# Patient Record
Sex: Male | Born: 1975 | Race: White | Hispanic: No | Marital: Single | Smoking: Never smoker
Health system: Southern US, Community
[De-identification: ages and names within clinical notes are randomized; demographics above are authoritative.]

## PROBLEM LIST (undated history)

## (undated) DIAGNOSIS — IMO0001 Reserved for inherently not codable concepts without codable children: Secondary | ICD-10-CM

## (undated) DIAGNOSIS — I2510 Atherosclerotic heart disease of native coronary artery without angina pectoris: Secondary | ICD-10-CM

## (undated) DIAGNOSIS — C6290 Malignant neoplasm of unspecified testis, unspecified whether descended or undescended: Secondary | ICD-10-CM

## (undated) DIAGNOSIS — K219 Gastro-esophageal reflux disease without esophagitis: Secondary | ICD-10-CM

## (undated) HISTORY — DX: Gastro-esophageal reflux disease without esophagitis: K21.9

## (undated) HISTORY — PX: TOOTH EXTRACTION: SUR596

## (undated) HISTORY — DX: Atherosclerotic heart disease of native coronary artery without angina pectoris: I25.10

## (undated) HISTORY — DX: Reserved for inherently not codable concepts without codable children: IMO0001

---

## 1995-03-13 DIAGNOSIS — C6290 Malignant neoplasm of unspecified testis, unspecified whether descended or undescended: Secondary | ICD-10-CM

## 1995-03-13 HISTORY — PX: OTHER SURGICAL HISTORY: SHX169

## 1995-03-13 HISTORY — DX: Malignant neoplasm of unspecified testis, unspecified whether descended or undescended: C62.90

## 1997-09-09 ENCOUNTER — Emergency Department (HOSPITAL_COMMUNITY): Admission: EM | Admit: 1997-09-09 | Discharge: 1997-09-09 | Payer: Self-pay | Admitting: Emergency Medicine

## 1997-12-12 ENCOUNTER — Emergency Department (HOSPITAL_COMMUNITY): Admission: EM | Admit: 1997-12-12 | Discharge: 1997-12-12 | Payer: Self-pay | Admitting: Emergency Medicine

## 1997-12-22 ENCOUNTER — Emergency Department (HOSPITAL_COMMUNITY): Admission: EM | Admit: 1997-12-22 | Discharge: 1997-12-22 | Payer: Self-pay | Admitting: Emergency Medicine

## 2000-08-20 ENCOUNTER — Ambulatory Visit (HOSPITAL_COMMUNITY): Admission: RE | Admit: 2000-08-20 | Discharge: 2000-08-20 | Payer: Self-pay | Admitting: Internal Medicine

## 2000-08-20 ENCOUNTER — Encounter: Payer: Self-pay | Admitting: Internal Medicine

## 2001-03-20 ENCOUNTER — Encounter: Admission: RE | Admit: 2001-03-20 | Discharge: 2001-03-20 | Payer: Self-pay | Admitting: Internal Medicine

## 2001-03-20 ENCOUNTER — Encounter: Payer: Self-pay | Admitting: Internal Medicine

## 2002-09-24 ENCOUNTER — Encounter: Admission: RE | Admit: 2002-09-24 | Discharge: 2002-09-24 | Payer: Self-pay | Admitting: Oncology

## 2002-09-24 ENCOUNTER — Encounter: Payer: Self-pay | Admitting: Oncology

## 2003-03-13 HISTORY — PX: ANTERIOR CRUCIATE LIGAMENT REPAIR: SHX115

## 2003-08-04 ENCOUNTER — Emergency Department (HOSPITAL_COMMUNITY): Admission: EM | Admit: 2003-08-04 | Discharge: 2003-08-04 | Payer: Self-pay | Admitting: Emergency Medicine

## 2003-10-07 ENCOUNTER — Encounter: Payer: Self-pay | Admitting: Internal Medicine

## 2004-01-23 ENCOUNTER — Emergency Department (HOSPITAL_COMMUNITY): Admission: EM | Admit: 2004-01-23 | Discharge: 2004-01-23 | Payer: Self-pay | Admitting: *Deleted

## 2004-06-30 ENCOUNTER — Ambulatory Visit: Payer: Self-pay | Admitting: Internal Medicine

## 2004-08-01 ENCOUNTER — Ambulatory Visit: Payer: Self-pay | Admitting: Internal Medicine

## 2004-09-26 ENCOUNTER — Ambulatory Visit: Payer: Self-pay | Admitting: Internal Medicine

## 2004-09-29 ENCOUNTER — Ambulatory Visit: Payer: Self-pay | Admitting: Oncology

## 2004-10-06 ENCOUNTER — Ambulatory Visit: Payer: Self-pay | Admitting: Internal Medicine

## 2005-01-11 ENCOUNTER — Ambulatory Visit: Payer: Self-pay | Admitting: Internal Medicine

## 2005-02-10 IMAGING — CR DG ELBOW COMPLETE 3+V*R*
2 series · 2 of 2 positions shown · non-contrast
Comparison: none

CLINICAL DATA: Right elbow pain after trauma to the olecranon process.
 RIGHT ELBOW 4 VIEWS ? 08/04/03
 There is no fracture or joint effusion.  There is soft tissue swelling over the olecranon process consistent with olecranon bursitis.
 IMPRESSION
 Olecranon bursitis.  No fracture or joint effusion.

[view not recorded (1 of 2)]
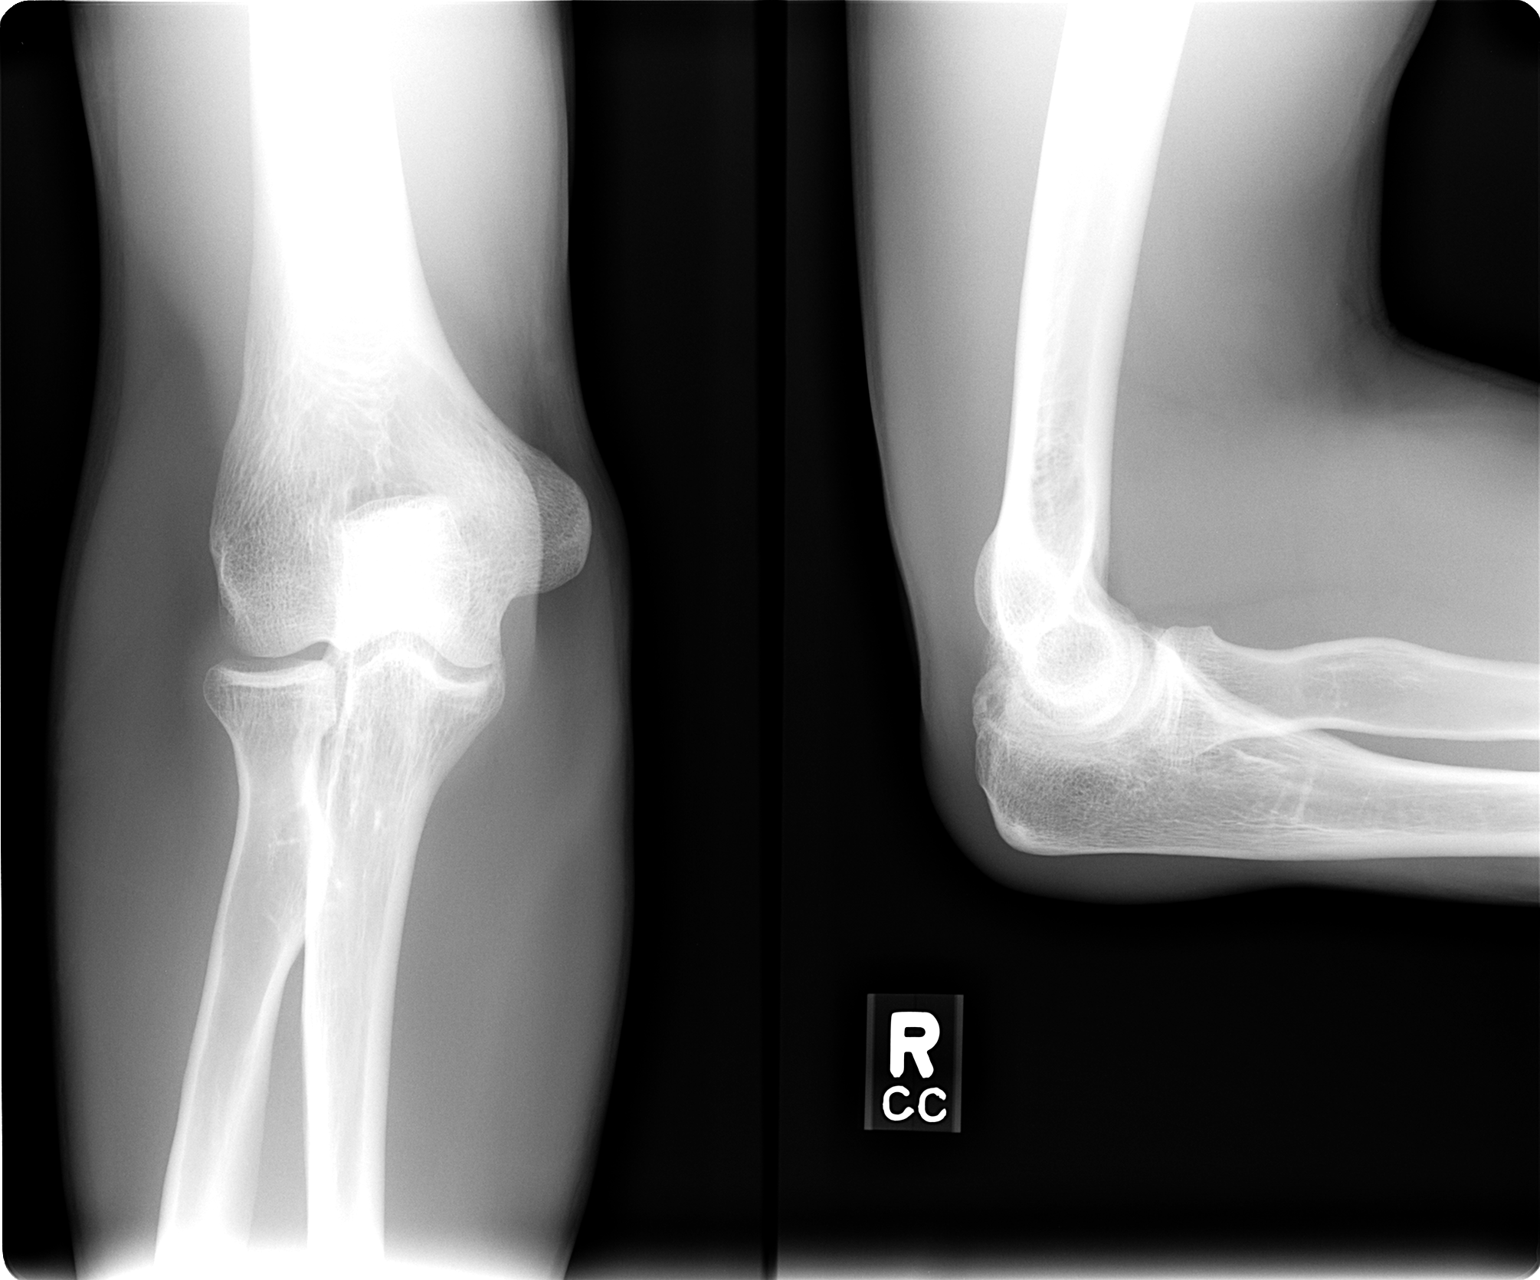

[view not recorded (2 of 2)]
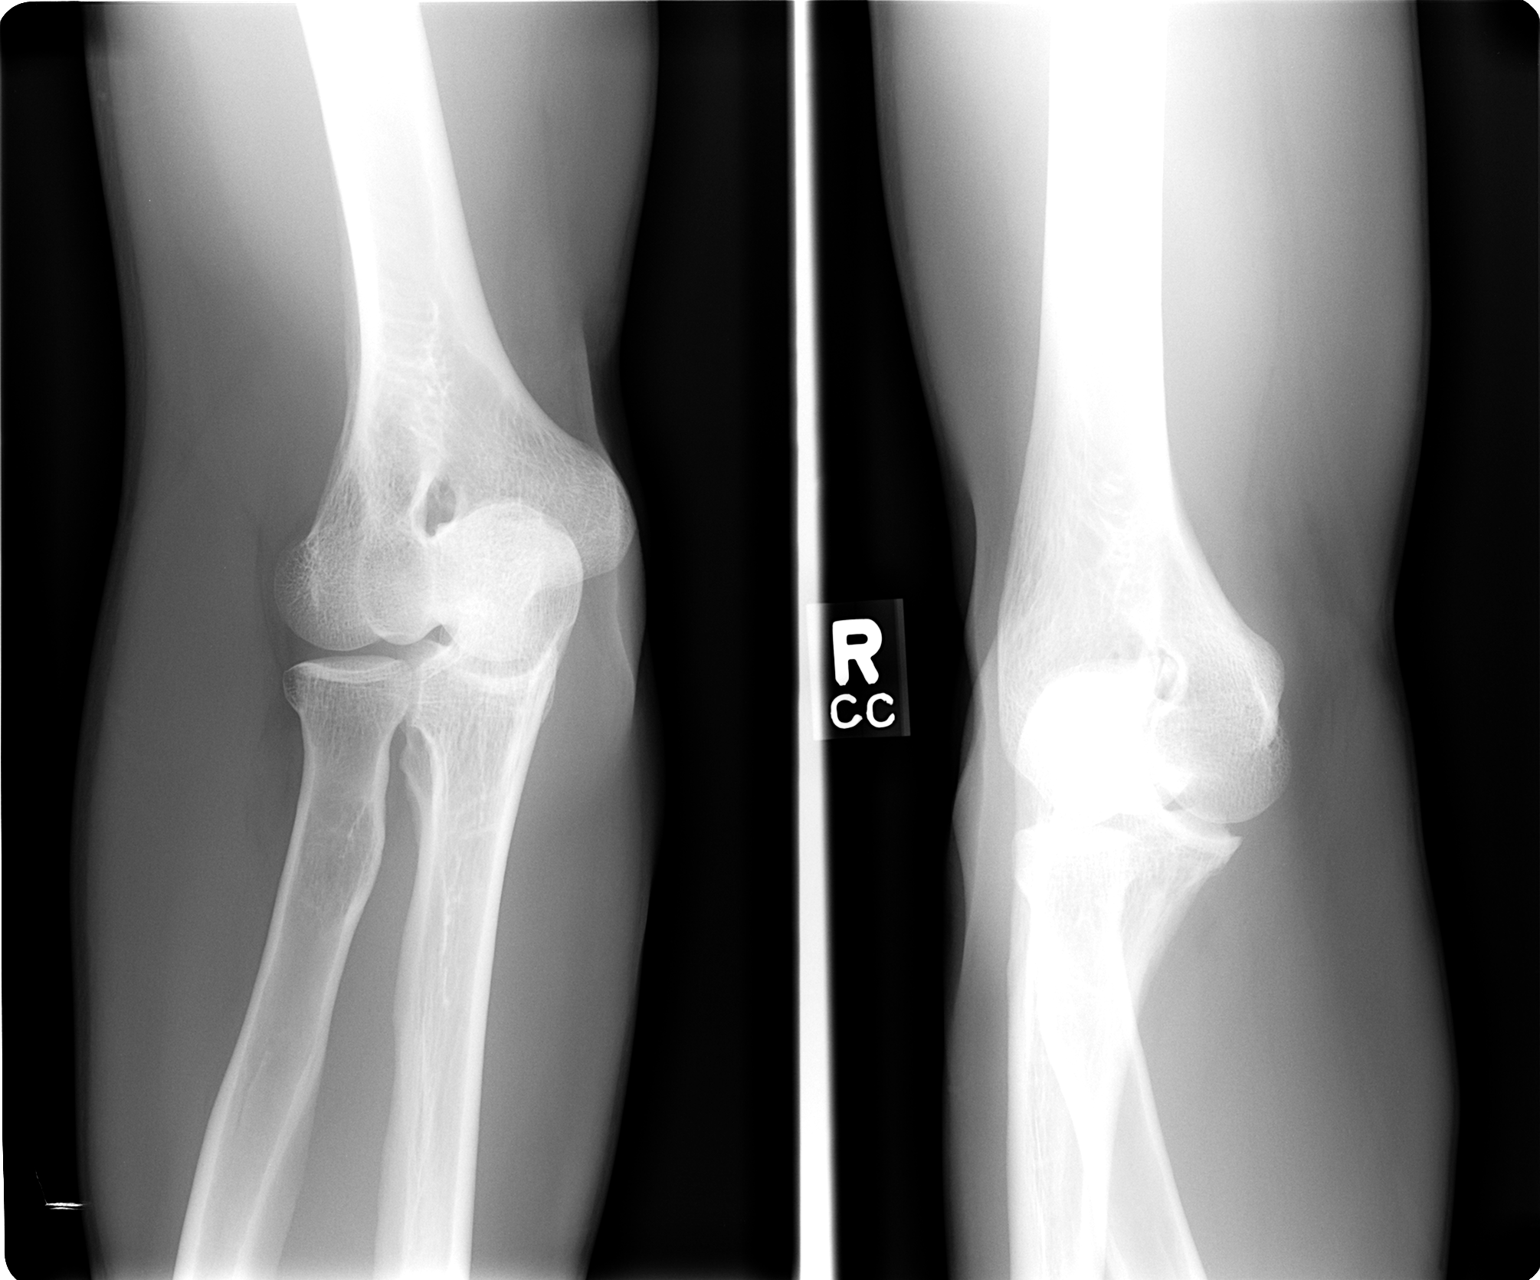

[2 of 2 positions shown; findings below may reference images not displayed]

## 2005-04-17 ENCOUNTER — Ambulatory Visit: Payer: Self-pay | Admitting: Internal Medicine

## 2005-09-25 ENCOUNTER — Ambulatory Visit: Payer: Self-pay | Admitting: Internal Medicine

## 2006-01-02 ENCOUNTER — Ambulatory Visit: Payer: Self-pay | Admitting: Internal Medicine

## 2006-02-04 ENCOUNTER — Ambulatory Visit: Payer: Self-pay | Admitting: Internal Medicine

## 2006-02-11 ENCOUNTER — Ambulatory Visit: Payer: Self-pay | Admitting: Internal Medicine

## 2006-02-11 LAB — CONVERTED CEMR LAB
ALT: 18 units/L (ref 0–40)
AST: 21 units/L (ref 0–37)
Albumin: 4.3 g/dL (ref 3.5–5.2)
Alkaline Phosphatase: 62 units/L (ref 39–117)
BUN: 16 mg/dL (ref 6–23)
Basophils Absolute: 0 10*3/uL (ref 0.0–0.1)
Basophils Relative: 0.2 % (ref 0.0–1.0)
CO2: 30 meq/L (ref 19–32)
Calcium: 9 mg/dL (ref 8.4–10.5)
Chloride: 106 meq/L (ref 96–112)
Chol/HDL Ratio, serum: 3.7
Cholesterol: 159 mg/dL (ref 0–200)
Creatinine, Ser: 1.2 mg/dL (ref 0.4–1.5)
Eosinophil percent: 1.4 % (ref 0.0–5.0)
GFR calc non Af Amer: 75 mL/min
Glomerular Filtration Rate, Af Am: 90 mL/min/{1.73_m2}
Glucose, Bld: 102 mg/dL — ABNORMAL HIGH (ref 70–99)
HCT: 48 % (ref 39.0–52.0)
HDL: 42.5 mg/dL (ref >39.0)
Hemoglobin: 16 g/dL (ref 13.0–17.0)
LDL Cholesterol: 105 mg/dL — ABNORMAL HIGH (ref 0–99)
Lymphocytes Relative: 28.8 % (ref 12.0–46.0)
MCHC: 33.4 g/dL (ref 30.0–36.0)
MCV: 93.2 fL (ref 78.0–100.0)
Monocytes Absolute: 0.4 10*3/uL (ref 0.2–0.7)
Monocytes Relative: 10.1 % (ref 3.0–11.0)
Neutro Abs: 2.6 10*3/uL (ref 1.4–7.7)
Neutrophils Relative %: 59.5 % (ref 43.0–77.0)
Platelets: 211 10*3/uL (ref 150–400)
Potassium: 3.7 meq/L (ref 3.5–5.1)
RBC: 5.15 M/uL (ref 4.22–5.81)
RDW: 12 % (ref 11.5–14.6)
Sodium: 143 meq/L (ref 135–145)
TSH: 1.41 microintl units/mL (ref 0.35–5.50)
Total Bilirubin: 1 mg/dL (ref 0.3–1.2)
Total Protein: 7.2 g/dL (ref 6.0–8.3)
Triglyceride fasting, serum: 60 mg/dL (ref 0–149)
VLDL: 12 mg/dL (ref 0–40)
WBC: 4.4 10*3/uL — ABNORMAL LOW (ref 4.5–10.5)

## 2006-02-18 ENCOUNTER — Ambulatory Visit: Payer: Self-pay | Admitting: Internal Medicine

## 2006-12-11 ENCOUNTER — Ambulatory Visit: Payer: Self-pay | Admitting: Internal Medicine

## 2007-02-03 ENCOUNTER — Telehealth: Payer: Self-pay | Admitting: Internal Medicine

## 2007-02-21 ENCOUNTER — Telehealth: Payer: Self-pay | Admitting: Internal Medicine

## 2007-07-01 ENCOUNTER — Ambulatory Visit: Payer: Self-pay | Admitting: Internal Medicine

## 2007-07-15 ENCOUNTER — Ambulatory Visit: Payer: Self-pay | Admitting: Gastroenterology

## 2007-07-15 ENCOUNTER — Encounter: Payer: Self-pay | Admitting: Gastroenterology

## 2007-08-14 ENCOUNTER — Ambulatory Visit: Payer: Self-pay | Admitting: Internal Medicine

## 2007-08-18 ENCOUNTER — Telehealth: Payer: Self-pay | Admitting: Gastroenterology

## 2007-08-29 ENCOUNTER — Telehealth: Payer: Self-pay | Admitting: Internal Medicine

## 2007-11-14 ENCOUNTER — Emergency Department (HOSPITAL_COMMUNITY): Admission: EM | Admit: 2007-11-14 | Discharge: 2007-11-14 | Payer: Self-pay | Admitting: Emergency Medicine

## 2007-11-14 ENCOUNTER — Telehealth: Payer: Self-pay | Admitting: Internal Medicine

## 2008-01-05 ENCOUNTER — Telehealth: Payer: Self-pay | Admitting: Internal Medicine

## 2008-01-08 ENCOUNTER — Telehealth: Payer: Self-pay | Admitting: Internal Medicine

## 2008-02-10 ENCOUNTER — Ambulatory Visit: Payer: Self-pay | Admitting: Gastroenterology

## 2008-02-18 ENCOUNTER — Ambulatory Visit: Payer: Self-pay | Admitting: Internal Medicine

## 2008-02-18 LAB — CONVERTED CEMR LAB
ALT: 23 units/L (ref 0–53)
Alkaline Phosphatase: 59 units/L (ref 39–117)
Basophils Absolute: 0 10*3/uL (ref 0.0–0.1)
Bilirubin Urine: NEGATIVE
Bilirubin, Direct: 0.1 mg/dL (ref 0.0–0.3)
Blood in Urine, dipstick: NEGATIVE
CO2: 30 meq/L (ref 19–32)
Calcium: 9.3 mg/dL (ref 8.4–10.5)
Cholesterol: 201 mg/dL (ref 0–200)
GFR calc Af Amer: 99 mL/min
Glucose, Bld: 96 mg/dL (ref 70–99)
Glucose, Urine, Semiquant: NEGATIVE
HDL: 43.3 mg/dL (ref >39.0)
Ketones, urine, test strip: NEGATIVE
Lymphocytes Relative: 23.6 % (ref 12.0–46.0)
MCHC: 35.4 g/dL (ref 30.0–36.0)
Monocytes Relative: 12.3 % — ABNORMAL HIGH (ref 3.0–12.0)
Neutro Abs: 3.2 10*3/uL (ref 1.4–7.7)
Platelets: 162 10*3/uL (ref 150–400)
Potassium: 3.9 meq/L (ref 3.5–5.1)
Protein, U semiquant: NEGATIVE
RDW: 12 % (ref 11.5–14.6)
Sodium: 141 meq/L (ref 135–145)
TSH: 1.66 microintl units/mL (ref 0.35–5.50)
Total Bilirubin: 1 mg/dL (ref 0.3–1.2)
Total CHOL/HDL Ratio: 4.6
Total Protein: 7.3 g/dL (ref 6.0–8.3)
Triglycerides: 70 mg/dL (ref 0–149)
Urobilinogen, UA: 0.2
VLDL: 14 mg/dL (ref 0–40)
pH: 7

## 2008-02-19 ENCOUNTER — Ambulatory Visit: Payer: Self-pay | Admitting: Internal Medicine

## 2008-02-19 DIAGNOSIS — Z8547 Personal history of malignant neoplasm of testis: Secondary | ICD-10-CM

## 2008-03-30 ENCOUNTER — Telehealth: Payer: Self-pay | Admitting: Gastroenterology

## 2008-03-31 ENCOUNTER — Ambulatory Visit: Payer: Self-pay | Admitting: Gastroenterology

## 2008-04-02 ENCOUNTER — Telehealth: Payer: Self-pay | Admitting: Gastroenterology

## 2008-04-28 ENCOUNTER — Telehealth: Payer: Self-pay | Admitting: Internal Medicine

## 2008-11-19 ENCOUNTER — Ambulatory Visit: Payer: Self-pay | Admitting: Family Medicine

## 2008-11-19 ENCOUNTER — Telehealth: Payer: Self-pay | Admitting: Internal Medicine

## 2008-11-26 ENCOUNTER — Ambulatory Visit: Payer: Self-pay | Admitting: Oncology

## 2008-11-26 ENCOUNTER — Encounter: Payer: Self-pay | Admitting: Internal Medicine

## 2008-11-26 LAB — CBC WITH DIFFERENTIAL/PLATELET
BASO%: 0.4 % (ref 0.0–2.0)
EOS%: 1 % (ref 0.0–7.0)
MCH: 31.9 pg (ref 27.2–33.4)
MCV: 91 fL (ref 79.3–98.0)
MONO%: 9.7 % (ref 0.0–14.0)
NEUT#: 4.3 10*3/uL (ref 1.5–6.5)
RBC: 4.72 10*6/uL (ref 4.20–5.82)
RDW: 12.8 % (ref 11.0–14.6)

## 2008-11-29 LAB — COMPREHENSIVE METABOLIC PANEL
ALT: 17 U/L (ref 0–53)
Alkaline Phosphatase: 54 U/L (ref 39–117)
Sodium: 138 mEq/L (ref 135–145)
Total Bilirubin: 0.5 mg/dL (ref 0.3–1.2)
Total Protein: 7.4 g/dL (ref 6.0–8.3)

## 2008-11-30 ENCOUNTER — Telehealth (INDEPENDENT_AMBULATORY_CARE_PROVIDER_SITE_OTHER): Payer: Self-pay | Admitting: *Deleted

## 2009-02-02 ENCOUNTER — Telehealth: Payer: Self-pay | Admitting: Internal Medicine

## 2009-06-03 ENCOUNTER — Telehealth: Payer: Self-pay | Admitting: Family Medicine

## 2009-12-14 ENCOUNTER — Telehealth: Payer: Self-pay | Admitting: Internal Medicine

## 2009-12-22 ENCOUNTER — Ambulatory Visit: Payer: Self-pay | Admitting: Internal Medicine

## 2010-02-23 ENCOUNTER — Telehealth: Payer: Self-pay | Admitting: Internal Medicine

## 2010-02-27 ENCOUNTER — Ambulatory Visit: Payer: Self-pay | Admitting: Internal Medicine

## 2010-04-06 ENCOUNTER — Encounter: Payer: Self-pay | Admitting: Internal Medicine

## 2010-04-06 ENCOUNTER — Ambulatory Visit
Admission: RE | Admit: 2010-04-06 | Discharge: 2010-04-06 | Payer: Self-pay | Source: Home / Self Care | Attending: Internal Medicine | Admitting: Internal Medicine

## 2010-04-06 ENCOUNTER — Other Ambulatory Visit: Payer: Self-pay | Admitting: Internal Medicine

## 2010-04-06 LAB — HEPATIC FUNCTION PANEL
Bilirubin, Direct: 0.1 mg/dL (ref 0.0–0.3)
Total Bilirubin: 0.6 mg/dL (ref 0.3–1.2)

## 2010-04-06 LAB — BASIC METABOLIC PANEL
BUN: 21 mg/dL (ref 6–23)
CO2: 27 mEq/L (ref 19–32)
Chloride: 103 mEq/L (ref 96–112)
Creatinine, Ser: 1 mg/dL (ref 0.4–1.5)
Glucose, Bld: 83 mg/dL (ref 70–99)

## 2010-04-06 LAB — LIPID PANEL
Cholesterol: 178 mg/dL (ref 0–200)
HDL: 38.9 mg/dL — ABNORMAL LOW (ref >39.00)
LDL Cholesterol: 126 mg/dL — ABNORMAL HIGH (ref 0–99)
Total CHOL/HDL Ratio: 5
Triglycerides: 66 mg/dL (ref 0.0–149.0)
VLDL: 13.2 mg/dL (ref 0.0–40.0)

## 2010-04-06 LAB — CBC WITH DIFFERENTIAL/PLATELET
Eosinophils Absolute: 0.1 10*3/uL (ref 0.0–0.7)
MCHC: 34.3 g/dL (ref 30.0–36.0)
MCV: 93.5 fl (ref 78.0–100.0)
Monocytes Absolute: 0.5 10*3/uL (ref 0.1–1.0)
Neutrophils Relative %: 63.2 % (ref 43.0–77.0)
Platelets: 203 10*3/uL (ref 150.0–400.0)
RDW: 13.7 % (ref 11.5–14.6)

## 2010-04-10 LAB — CONVERTED CEMR LAB
LDH: 160 units/L (ref 94–250)
hCG, Beta Chain, Quant, S: <2 milliintl units/mL

## 2010-04-11 NOTE — Progress Notes (Signed)
Summary: REQ FOR ORDERS  Phone Note Call from Patient   Caller: Patient   [phone] Summary of Call: Pt called and made appt for cpx / labwork...Jose KitchenMarland Reyes Pt would like to know if he can have an order for cancer markers to be added to cpx labwork  so the results can be sent to Dr Cyndie Chime at [facility]....Jose KitchenMarland KitchenCan you advise what labs need to be ordered?    Initial call taken by: Debbra Riding,  December 14, 2009 2:44 PM  Follow-up for Phone Call        yes  Beta-HCG Follow-up by: Birdie Sons MD,  December 15, 2009 11:15 AM  Additional Follow-up for Phone Call Additional follow up Details #1::        Added to labwork appt on 10/12.  Additional Follow-up by: Debbra Riding,  December 15, 2009 11:42 AM

## 2010-04-11 NOTE — Progress Notes (Signed)
Summary: REQ FOR RX sw pt who wants antibiotic now  Phone Note Call from Patient   Caller: Patient   [phone] Call For: fry for swords Reason for Call: Talk to Nurse, Talk to Doctor Summary of Call: Pt called to adv that he has been exp sxs of sore throat, cough, congestion (head/nasal).... Pt adv that he is currently out of town working and will be leaving on Sunday 06/05/2009 to go out of the country ([place]Marland KitchenMarland KitchenTherefore, he can't come in for OV this week.... Pt would like to have an antibiotic / [facility] [address]  Allergic to Pcn  Pt can be reached at [phone] with any questions or concerns. Calling again.  Requests this be done now, he is out of town & can't come to office,  he has to have med, Dr. Cato Mulligan is my doctor, & you have to get this done for me now.  Wants Dr. Clent Ridges or any doctor to do this for him.  Rudy Jew, RN  June 03, 2009 3:06 PM  Initial call taken by: Debbra Riding,  June 03, 2009 10:45 AM  Follow-up for Phone Call        call in a Zpack Follow-up by: Nelwyn Salisbury MD,  June 03, 2009 3:34 PM  Additional Follow-up for Phone Call Additional follow up Details #1::        Rx Called In Additional Follow-up by: Raechel Ache, RN,  June 03, 2009 4:01 PM

## 2010-04-13 NOTE — Assessment & Plan Note (Signed)
Summary: cpx/pt coming in fasting/med check/cjr   Vital Signs:  Patient profile:   35 year old male Height:      76 inches Weight:      192 pounds BMI:     23.46 Temp:     98.4 degrees F oral Pulse rate:   72 / minute Pulse rhythm:   regular BP sitting:   130 / 80  (left arm) Cuff size:   large  Vitals Entered By: Alfred Levins, CMA (April 06, 2010 8:25 AM) CC: cpx, fasting   Primary Care Provider:  Birdie Sons, MD  CC:  cpx and fasting.  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 One Q 2 To 3 Days As Directed--Needs Office Visit For Additional Refills--Last Refill Before Office Visit  Allergies (verified): 1)  ! * Bee Stings 2)  Pcn  Family History: Reviewed history from 07/01/2007 and no changes required. Family History Hypertension Family History of Cardiovascular disorder father diagnosed with esophageal or stomach cancer, 2009  Social History: Reviewed history from 11/15/2006 and no changes required. Never Smoked Alcohol use-yes has long term girlfriedn one dtr-healthy  Physical Exam  General:  alert and well-developed.   Head:  normocephalic and atraumatic.   Eyes:  pupils equal and pupils round.   Ears:  R ear normal and L ear normal.   Neck:  No deformities, masses, or tenderness noted. Lungs:  Normal respiratory effort, chest expands symmetrically. Lungs are clear to auscultation, no crackles or wheezes. Heart:  normal rate and regular rhythm.   Abdomen:  Bowel sounds positive,abdomen soft and non-tender without masses, organomegaly or hernias noted. Genitalia:  one testicle no masses circumcised.   Msk:  No deformity or scoliosis noted of thoracic or lumbar spine.   Neurologic:  cranial nerves II-XII intact and gait normal.    Skin:  turgor normal and color normal.   Psych:   normally interactive.     Impression & Recommendations:  Problem # 1:  ROUTINE GENERAL MEDICAL EXAM@HEALTH  CARE FACL (ICD-V70.0) health maint UTD Orders: UA Dipstick w/o Micro (automated)  (81003) Venipuncture (16109) Specimen Handling (60454) TLB-Lipid Panel (80061-LIPID) TLB-BMP (Basic Metabolic Panel-BMET) (80048-METABOL) TLB-CBC Platelet - w/Differential (85025-CBCD) TLB-Hepatic/Liver Function Pnl (80076-HEPATIC) TLB-TSH (Thyroid Stimulating Hormone) (84443-TSH)  Complete Medication List: 1)  Imipramine Hcl 10 Mg Tabs (Imipramine hcl) .... One every 2-3 nights as needed for sleep  Other Orders: T-LDH (09811-91478) T-AFP Tumor Markers [phone]) T-Hcg (57846-96295) Prescriptions: IMIPRAMINE HCL 10 MG  TABS (IMIPRAMINE HCL) one every 2-3 nights as needed for sleep  #60 x 3   Entered and Authorized by:   Birdie Sons MD   Signed by:   Birdie Sons MD on 04/06/2010   Method used:   [facility]        [phone][facility] (retail)       [address] [place], [place]  [address]       Ph: [phone] or [phone]       Fax: [phone]   RxID:   [id number]    Orders Added: 1)  UA Dipstick w/o Micro (automated)  [81003] 2)  Venipuncture [10932] 3)  Specimen Handling [99000] 4)  TLB-Lipid Panel [80061-LIPID] 5)  TLB-BMP (Basic Metabolic Panel-BMET) [80048-METABOL] 6)  TLB-CBC Platelet -  w/Differential [85025-CBCD] 7)  TLB-Hepatic/Liver Function Pnl [80076-HEPATIC] 8)  TLB-TSH (Thyroid Stimulating Hormone) [84443-TSH] 9)  Est. Patient 18-39 years [99395] 10)  T-LDH [16109-60454] 11)  T-AFP Tumor Markers [82105-81230] 12)  T-Hcg [09811-91478]  Appended Document: Orders Update    Clinical Lists Changes  Observations: Added new observation of COMMENTS: Wynona Canes, CMA  April 06, 2010 10:06 AM  (04/06/2010 10:06) Added new observation of PH URINE: 6.5  (04/06/2010  10:06) Added new observation of SPEC GR URIN: 1.020  (04/06/2010 10:06) Added new observation of APPEARANCE U: Clear  (04/06/2010 10:06) Added new observation of UA COLOR: yellow  (04/06/2010 10:06) Added new observation of WBC DIPSTK U: negative  (04/06/2010 10:06) Added new observation of NITRITE URN: negative  (04/06/2010 10:06) Added new observation of UROBILINOGEN: 0.2  (04/06/2010 10:06) Added new observation of PROTEIN, URN: negative  (04/06/2010 10:06) Added new observation of BLOOD UR DIP: negative  (04/06/2010 10:06) Added new observation of KETONES URN: negative  (04/06/2010 10:06) Added new observation of BILIRUBIN UR: negative  (04/06/2010 10:06) Added new observation of GLUCOSE, URN: negative  (04/06/2010 10:06)      Laboratory Results   Urine Tests  Date/Time Recieved: April 06, 2010 10:06 AM  Date/Time Reported: April 06, 2010 10:06 AM   Routine Urinalysis   Color: yellow Appearance: Clear Glucose: negative   (Normal Range: Negative) Bilirubin: negative   (Normal Range: Negative) Ketone: negative   (Normal Range: Negative) Spec. Gravity: 1.020   (Normal Range: 1.003-1.035) Blood: negative   (Normal Range: Negative) pH: 6.5   (Normal Range: 5.0-8.0) Protein: negative   (Normal Range: Negative) Urobilinogen: 0.2   (Normal Range: 0-1) Nitrite: negative   (Normal Range: Negative) Leukocyte Esterace: negative   (Normal Range: Negative)    Comments: Wynona Canes, CMA  April 06, 2010 10:06 AM

## 2010-04-13 NOTE — Progress Notes (Signed)
Summary: work in cpx before end of yr   Phone Note Call from Patient Call back at [employer] [phone]   Caller: Patient Call For: Birdie Sons MD Summary of Call: pt is requesting cpx before end of yr. Pt is aware short notice. Can create 30 min slot? Initial call taken by: Heron Sabins,  February 23, 2010 3:44 PM  Follow-up for Phone Call        ok Follow-up by: Birdie Sons MD,  February 24, 2010 12:15 PM  Additional Follow-up for Phone Call Additional follow up Details #1::        lmom lmom 02/28/2010 Additional Follow-up by: Heron Sabins,  February 27, 2010 10:02 AM    Additional Follow-up for Phone Call Additional follow up Details #2::    lmom/lmom 03-03-2010 430pm  Follow-up by: Heron Sabins,  March 02, 2010 8:56 AM  Additional Follow-up for Phone Call Additional follow up Details #3:: Details for Additional Follow-up Action Taken: AFTER SEVERAL ATTEMPTS PT DID NOT CB Additional Follow-up by: Heron Sabins,  March 21, 2010 8:38 AM

## 2010-06-05 ENCOUNTER — Other Ambulatory Visit: Payer: Self-pay | Admitting: Internal Medicine

## 2010-07-31 ENCOUNTER — Telehealth: Payer: Self-pay | Admitting: *Deleted

## 2010-07-31 NOTE — Telephone Encounter (Signed)
Ok to increase wellburtrin to 300mg 

## 2010-07-31 NOTE — Telephone Encounter (Signed)
patient  Would like a refill of his Wellbutrin.  He would like to go back to the 300 mg if possible.

## 2010-08-01 MED ORDER — BUPROPION HCL ER (XL) 300 MG PO TB24: 300.0000 mg | ORAL_TABLET | ORAL | Status: DC

## 2010-08-01 NOTE — Telephone Encounter (Signed)
LMTCB

## 2010-08-01 NOTE — Telephone Encounter (Signed)
Notified pt. 

## 2010-08-21 ENCOUNTER — Encounter: Payer: Self-pay | Admitting: Internal Medicine

## 2010-08-21 ENCOUNTER — Ambulatory Visit (INDEPENDENT_AMBULATORY_CARE_PROVIDER_SITE_OTHER): Payer: BC Managed Care – PPO | Admitting: Internal Medicine

## 2010-08-21 VITALS — BP 130/82 | Temp 98.2°F | Ht 76.0 in | Wt 200.0 lb

## 2010-08-21 DIAGNOSIS — J069 Acute upper respiratory infection, unspecified: Secondary | ICD-10-CM

## 2010-08-21 NOTE — Patient Instructions (Signed)
ZEZIRA  1 teaspoon every 6 hours as needed for cough and congestion  Get plenty of rest, Drink lots of  clear liquids, and use Tylenol or ibuprofen for fever and discomfort.    Call or return to clinic prn if these symptoms worsen or fail to improve as anticipated.

## 2010-08-21 NOTE — Progress Notes (Signed)
  Subjective:    Patient ID: Jose Reyes, male    DOB: Aug 07, 1975, 35 y.o.   MRN: [id number]  HPI 35 year old patient who has a five-day history of nasal congestion and mild cough. He has had no fever.  He will be leaving town later in the week and was concerned about the clinical worsening. Nasal drainages mildly discolored denies any headache localized sinus or dental pain    Review of Systems  Constitutional: Negative for fever, chills, appetite change and fatigue.  HENT: Positive for congestion. Negative for hearing loss, ear pain, sore throat, trouble swallowing, neck stiffness, dental problem, voice change and tinnitus.   Eyes: Negative for pain, discharge and visual disturbance.  Respiratory: Negative for cough, chest tightness, wheezing and stridor.   Cardiovascular: Negative for chest pain, palpitations and leg swelling.  Gastrointestinal: Negative for nausea, vomiting, abdominal pain, diarrhea, constipation, blood in stool and abdominal distention.  Genitourinary: Negative for urgency, hematuria, flank pain, discharge, difficulty urinating and genital sores.  Musculoskeletal: Negative for myalgias, back pain, joint swelling, arthralgias and gait problem.  Skin: Negative for rash.  Neurological: Negative for dizziness, syncope, speech difficulty, weakness, numbness and headaches.  Hematological: Negative for adenopathy. Does not bruise/bleed easily.  Psychiatric/Behavioral: Negative for behavioral problems and dysphoric mood. The patient is not nervous/anxious.        Objective:   Physical Exam  Constitutional: He is oriented to person, place, and time. He appears well-developed.  HENT:  Head: Normocephalic.  Right Ear: External ear normal.  Left Ear: External ear normal.       No sinus tenderness  Eyes: Conjunctivae and EOM are normal.  Neck: Normal range of motion.  Cardiovascular: Normal rate and normal heart sounds.   Pulmonary/Chest: Breath sounds normal.  Abdominal:  Bowel sounds are normal.  Musculoskeletal: Normal range of motion. He exhibits no edema and no tenderness.  Neurological: He is alert and oriented to person, place, and time.  Psychiatric: He has a normal mood and affect. His behavior is normal.          Assessment & Plan:  Viral URI. Will treat symptomatically

## 2010-10-25 ENCOUNTER — Encounter: Payer: Self-pay | Admitting: Family Medicine

## 2010-10-25 ENCOUNTER — Ambulatory Visit (INDEPENDENT_AMBULATORY_CARE_PROVIDER_SITE_OTHER): Payer: BC Managed Care – PPO | Admitting: Family Medicine

## 2010-10-25 VITALS — BP 138/86 | HR 68 | Temp 98.5°F | Wt 196.0 lb

## 2010-10-25 DIAGNOSIS — J329 Chronic sinusitis, unspecified: Secondary | ICD-10-CM

## 2010-10-25 MED ORDER — AZITHROMYCIN 250 MG PO TABS: ORAL_TABLET | ORAL | Status: AC

## 2010-10-25 NOTE — Progress Notes (Signed)
  Subjective:    Patient ID: Jose Reyes, male    DOB: March 30, 1975, 35 y.o.   MRN: [id number]  HPI Here for 3 days of sinus pressure, PND, HA, ST, blowing green mucus form the nose, and a dry cough. No fever.    Review of Systems  Constitutional: Negative.   HENT: Positive for congestion, postnasal drip and sinus pressure.   Eyes: Negative.   Respiratory: Positive for cough.        Objective:   Physical Exam  Constitutional: He appears well-developed and well-nourished.  HENT:  Right Ear: External ear normal.  Left Ear: External ear normal.  Nose: Nose normal.  Mouth/Throat: Oropharynx is clear and moist. No oropharyngeal exudate.  Eyes: Conjunctivae are normal. Pupils are equal, round, and reactive to light.  Neck: No thyromegaly present.  Pulmonary/Chest: Effort normal and breath sounds normal. No respiratory distress. He has no wheezes. He has no rales. He exhibits no tenderness.  Lymphadenopathy:    He has no cervical adenopathy.          Assessment & Plan:  Rest , fluids

## 2010-11-01 ENCOUNTER — Other Ambulatory Visit: Payer: Self-pay | Admitting: Internal Medicine

## 2011-01-02 ENCOUNTER — Other Ambulatory Visit: Payer: Self-pay | Admitting: Internal Medicine

## 2011-01-02 MED ORDER — ACYCLOVIR 5 % EX OINT: TOPICAL_OINTMENT | Freq: Three times a day (TID) | CUTANEOUS | Status: DC

## 2011-01-02 NOTE — Telephone Encounter (Signed)
Ok per Dr Swords, rx sent in electronically 

## 2011-01-02 NOTE — Telephone Encounter (Signed)
Pt is req to get renewal for ZOVIRAX 5 %  CREA (ACYCLOVIR) Apply three times a day  #1 g. x 3 [facility] [address] [facility] [address] [facility]. Pt is getting a cold sore on lip. Needs asap.

## 2011-01-04 ENCOUNTER — Other Ambulatory Visit: Payer: Self-pay | Admitting: Internal Medicine

## 2011-01-29 ENCOUNTER — Telehealth: Payer: Self-pay | Admitting: Internal Medicine

## 2011-01-29 NOTE — Telephone Encounter (Signed)
Ok to draw labs and check bp. This can be done as nursing visit

## 2011-01-29 NOTE — Telephone Encounter (Signed)
Pt has to have a biometric screening done before 02/16/11 for his ins. Needs to include bp check, finger stick test for blood glucose and cholesterol lvls. Pt req work in [employer] with pcp or another doctor if ok.

## 2011-01-29 NOTE — Telephone Encounter (Signed)
Needs also total chol, ldl,hdl,triglyceride bp and weight. Please advise patient.thanks.

## 2011-01-30 NOTE — Telephone Encounter (Signed)
lmoam to return my call. °

## 2011-01-31 NOTE — Telephone Encounter (Signed)
Pt will come next Tuesday as indicated. Thanks.

## 2011-02-06 ENCOUNTER — Ambulatory Visit (INDEPENDENT_AMBULATORY_CARE_PROVIDER_SITE_OTHER): Payer: BC Managed Care – PPO | Admitting: Internal Medicine

## 2011-02-06 DIAGNOSIS — Z0000 Encounter for general adult medical examination without abnormal findings: Secondary | ICD-10-CM

## 2011-02-06 LAB — LIPID PANEL
HDL: 51.5 mg/dL (ref >39.00)
LDL Cholesterol: 132 mg/dL — ABNORMAL HIGH (ref 0–99)
Total CHOL/HDL Ratio: 4
Triglycerides: 79 mg/dL (ref 0.0–149.0)

## 2011-02-06 LAB — BASIC METABOLIC PANEL
BUN: 26 mg/dL — ABNORMAL HIGH (ref 6–23)
CO2: 27 mEq/L (ref 19–32)
Chloride: 106 mEq/L (ref 96–112)
Glucose, Bld: 90 mg/dL (ref 70–99)
Potassium: 4.1 mEq/L (ref 3.5–5.1)

## 2011-03-19 ENCOUNTER — Emergency Department (HOSPITAL_COMMUNITY)
Admission: EM | Admit: 2011-03-19 | Discharge: 2011-03-19 | Disposition: A | Discharge status: Home / Self Care | Payer: BC Managed Care – PPO | Attending: Emergency Medicine | Admitting: Emergency Medicine

## 2011-03-19 ENCOUNTER — Encounter (HOSPITAL_COMMUNITY): Payer: Self-pay

## 2011-03-19 DIAGNOSIS — Z8547 Personal history of malignant neoplasm of testis: Secondary | ICD-10-CM | POA: Insufficient documentation

## 2011-03-19 DIAGNOSIS — J111 Influenza due to unidentified influenza virus with other respiratory manifestations: Secondary | ICD-10-CM | POA: Insufficient documentation

## 2011-03-19 HISTORY — DX: Malignant neoplasm of unspecified testis, unspecified whether descended or undescended: C62.90

## 2011-03-19 MED ORDER — SODIUM CHLORIDE 0.9 % IV BOLUS (SEPSIS)
1000.0000 mL | Freq: Once | INTRAVENOUS | Status: AC
  Administered 2011-03-19: 1000 mL via INTRAVENOUS

## 2011-03-19 MED ORDER — DEXAMETHASONE SODIUM PHOSPHATE 10 MG/ML IJ SOLN
10.0000 mg | Freq: Once | INTRAMUSCULAR | Status: AC
  Administered 2011-03-19: 10 mg via INTRAVENOUS
  Filled 2011-03-19: qty 1

## 2011-03-19 MED ORDER — MORPHINE SULFATE 4 MG/ML IJ SOLN
4.0000 mg | Freq: Once | INTRAMUSCULAR | Status: AC
  Administered 2011-03-19: 4 mg via INTRAVENOUS
  Filled 2011-03-19: qty 1

## 2011-03-19 MED ORDER — DIPHENHYDRAMINE HCL 50 MG/ML IJ SOLN
25.0000 mg | Freq: Once | INTRAMUSCULAR | Status: AC
  Administered 2011-03-19: 06:00:00 via INTRAVENOUS
  Filled 2011-03-19: qty 1

## 2011-03-19 MED ORDER — METOCLOPRAMIDE HCL 5 MG/ML IJ SOLN
10.0000 mg | Freq: Once | INTRAMUSCULAR | Status: AC
  Administered 2011-03-19: 10 mg via INTRAVENOUS
  Filled 2011-03-19: qty 2

## 2011-03-19 NOTE — ED Provider Notes (Signed)
Medical screening examination/treatment/procedure(s) were performed by non-physician practitioner and as supervising physician I was immediately available for consultation/collaboration.  Delrico Minehart K Bhavya Grand-Rasch, MD 03/19/11 0718 

## 2011-03-19 NOTE — ED Notes (Signed)
Influenza since last Tuesday.  Headache that started on Saturday.  Seen at urgent care [facility] [address] for flu like symptoms and headache.  Pt given percocet and a shot with some relief.  Pt reports severe headache again

## 2011-03-19 NOTE — ED Notes (Signed)
Awaiting bolus infusion completion to d/c pt

## 2011-03-19 NOTE — ED Provider Notes (Signed)
History     CSN: [id number]  Arrival date & time 03/19/11  4098   First MD Initiated Contact with Patient 03/19/11 [phone]      Chief Complaint  Patient presents with  . Influenza    (Consider location/radiation/quality/duration/timing/severity/associated sxs/prior treatment) HPI Comments: Patient here after having influenza like symptoms since Tuesday - he states that he began with body aches, chills, cough without sputum production, nasal congestion and sore throat, he states that he called a physician friend of his who called him in Tamiflu which he started taking on Saturday.  He states at that time he also then developed a headache.  Once this came on he went to [facility] where he again was diagnosed with influenza and was given percocet for the pain which he reports has been helping.  He is here because he is having a severe headache again - he denies fever, neck stiffness, cough but continues with the body aches.    Patient is a 36 y.o. male presenting with flu symptoms. The history is provided by the patient. No language interpreter was used.  Influenza This is a new problem. The current episode started in the past 7 days. The problem occurs constantly. The problem has been waxing and waning. Associated symptoms include anorexia, chills, congestion, coughing, fatigue, headaches, myalgias and a sore throat. Pertinent negatives include no abdominal pain, arthralgias, change in bowel habit, chest pain, diaphoresis, fever, joint swelling, nausea, neck pain, numbness, rash, swollen glands, urinary symptoms, visual change, vomiting or weakness. The symptoms are aggravated by nothing. He has tried oral narcotics for the symptoms. The treatment provided mild relief.    Past Medical History  Diagnosis Date  . Cancer   . Testicular cancer     Past Surgical History  Procedure Date  . Joint replacement     No family history on file.  History  Substance Use Topics  .  Smoking status: Never Smoker   . Smokeless tobacco: Never Used  . Alcohol Use: Yes      Review of Systems  Constitutional: Positive for chills and fatigue. Negative for fever and diaphoresis.  HENT: Positive for congestion and sore throat. Negative for neck pain.   Respiratory: Positive for cough.   Cardiovascular: Negative for chest pain.  Gastrointestinal: Positive for anorexia. Negative for nausea, vomiting, abdominal pain and change in bowel habit.  Musculoskeletal: Positive for myalgias. Negative for joint swelling and arthralgias.  Skin: Negative for rash.  Neurological: Positive for headaches. Negative for weakness and numbness.  All other systems reviewed and are negative.    Allergies  Penicillins  Home Medications   Current Outpatient Rx  Name Route Sig Dispense Refill  . BUPROPION HCL ER (XL) 300 MG PO TB24  TAKE 1 TABLET BY MOUTH EVERY MORNING 30 tablet 2  . IBUPROFEN 200 MG PO TABS Oral Take 200 mg by mouth every 6 (six) hours as needed.      . IMIPRAMINE HCL 10 MG PO TABS Oral Take 10 mg by mouth at bedtime as needed.      . MELOXICAM 15 MG PO TABS Oral Take 15 mg by mouth daily.      Marland Kitchen OMEPRAZOLE MAGNESIUM 20 MG PO TBEC Oral Take 20 mg by mouth daily.      . ACYCLOVIR 5 % EX OINT Topical Apply topically 3 (three) times daily. 15 g 0  . ZOVIRAX 5 % EX CREA  APPLY 3 TIMES A DAY AS NEEDED 5 g 0  CUSTOMER WANTS CREAM NOT OINTMENT PLEASE    BP 149/83  Pulse 67  Temp(Src) 97.6 F (36.4 C) (Oral)  Resp 18  SpO2 100%  Physical Exam  Nursing note and vitals reviewed. Constitutional: He is oriented to person, place, and time. He appears well-developed and well-nourished. No distress.  HENT:  Head: Normocephalic and atraumatic.  Right Ear: External ear normal.  Left Ear: External ear normal.  Nose: Nose normal.  Mouth/Throat: Oropharynx is clear and moist. No oropharyngeal exudate.  Eyes: Conjunctivae are normal. Pupils are equal, round, and reactive to  light. No scleral icterus.  Neck: Normal range of motion. Neck supple. No Brudzinski's sign and no Kernig's sign noted.  Cardiovascular: Normal rate, regular rhythm and normal heart sounds.  Exam reveals no gallop and no friction rub.   No murmur heard. Pulmonary/Chest: Effort normal and breath sounds normal. No respiratory distress. He exhibits no tenderness.  Abdominal: Soft. Bowel sounds are normal. He exhibits no distension. There is no tenderness.  Musculoskeletal: Normal range of motion. He exhibits no edema and no tenderness.  Lymphadenopathy:    He has no cervical adenopathy.  Neurological: He is alert and oriented to person, place, and time. No cranial nerve deficit.  Skin: Skin is warm and dry. No rash noted. No erythema. No pallor.  Psychiatric: He has a normal mood and affect. His behavior is normal. Judgment and thought content normal.    ED Course  Procedures (including critical care time)   Labs Reviewed  MONONUCLEOSIS SCREEN   No results found. Results for orders placed during the hospital encounter of 03/19/11  MONONUCLEOSIS SCREEN      Component Value Range   Mono Screen NEGATIVE  NEGATIVE    No results found.   Influenza    MDM  Patient remains with influenza like illness symptoms.  Monospot negative - I doubt meningitis as there is no nuchal ridgitity or fever, I think the residual headache is likely related to the flu and dehydration.  I have given the patient two liters of fluids here and pain control and will discharge after this.      Izola Price [employer], [place] 03/19/11 [phone]

## 2011-03-21 ENCOUNTER — Ambulatory Visit (INDEPENDENT_AMBULATORY_CARE_PROVIDER_SITE_OTHER): Payer: BC Managed Care – PPO | Admitting: Family

## 2011-03-21 ENCOUNTER — Encounter: Payer: Self-pay | Admitting: Family

## 2011-03-21 ENCOUNTER — Telehealth: Payer: Self-pay | Admitting: Internal Medicine

## 2011-03-21 VITALS — BP 144/82 | Temp 98.1°F | Wt 191.0 lb

## 2011-03-21 DIAGNOSIS — H9202 Otalgia, left ear: Secondary | ICD-10-CM

## 2011-03-21 DIAGNOSIS — Z23 Encounter for immunization: Secondary | ICD-10-CM

## 2011-03-21 DIAGNOSIS — H6690 Otitis media, unspecified, unspecified ear: Secondary | ICD-10-CM

## 2011-03-21 MED ORDER — AZITHROMYCIN 250 MG PO TABS: ORAL_TABLET | ORAL | Status: AC

## 2011-03-21 NOTE — Telephone Encounter (Signed)
[facility] (ZITHROMAX Z-PAK) 250 MG tablet [[id number]]  [facility]. Pt requesting it be resent

## 2011-03-21 NOTE — Patient Instructions (Signed)

## 2011-03-21 NOTE — Telephone Encounter (Signed)
Rx phoned in.   

## 2011-03-21 NOTE — Progress Notes (Signed)
Subjective:    Patient ID: Jose Reyes, male    DOB: 12-Dec-1975, 36 y.o.   MRN: [id number]  HPI 36 year old white male in with complaints of left ear pain and to like it's clogged; 2 days. He has had sneezing and, coughing and congestion. He is recovering from influenza. Denies any drainage or discharge from the ear. He has not tried any medication for it.   Review of Systems  Constitutional: Negative.   HENT: Positive for ear pain, congestion, sneezing and postnasal drip. Negative for hearing loss and ear discharge.        Left ear pain  Eyes: Negative.   Respiratory: Positive for cough.   Gastrointestinal: Negative.   Genitourinary: Negative.   Musculoskeletal: Negative.   Skin: Negative.   Neurological: Negative.   Hematological: Negative.   Psychiatric/Behavioral: Negative.        Past Medical History  Diagnosis Date  . Cancer   . Testicular cancer     History   Social History  . Marital Status: Single    Spouse Name: N/A    Number of Children: N/A  . Years of Education: N/A   Occupational History  . Not on file.   Social History Main Topics  . Smoking status: Never Smoker   . Smokeless tobacco: Never Used  . Alcohol Use: Yes  . Drug Use: No  . Sexually Active: Not on file   Other Topics Concern  . Not on file   Social History Narrative  . No narrative on file    Past Surgical History  Procedure Date  . Joint replacement     No family history on file.  Allergies  Allergen Reactions  . Penicillins     REACTION: unknown    Current Outpatient Prescriptions on File Prior to Visit  Medication Sig Dispense Refill  . acyclovir (ZOVIRAX) 5 % ointment Apply topically 3 (three) times daily.  15 g  0  . buPROPion (WELLBUTRIN XL) 300 MG 24 hr tablet TAKE 1 TABLET BY MOUTH EVERY MORNING  30 tablet  2  . ibuprofen (ADVIL,MOTRIN) 200 MG tablet Take 200 mg by mouth every 6 (six) hours as needed.        Marland Kitchen imipramine (TOFRANIL) 10 MG tablet Take 10 mg by  mouth at bedtime as needed.        . meloxicam (MOBIC) 15 MG tablet Take 15 mg by mouth daily.        Marland Kitchen omeprazole (PRILOSEC OTC) 20 MG tablet Take 20 mg by mouth daily.        Marland Kitchen ZOVIRAX 5 % cream APPLY 3 TIMES A DAY AS NEEDED  5 g  0    BP 144/82  Temp(Src) 98.1 F (36.7 C) (Oral)  Wt 191 lb (86.637 kg)chart Objective:   Physical Exam  Constitutional: He appears well-developed and well-nourished.  HENT:  Right Ear: External ear normal.  Left Ear: External ear normal.  Nose: Nose normal.  Mouth/Throat: Oropharynx is clear and moist.       Left ear radiated, bulging tympanic membrane  Neck: Normal range of motion. Neck supple.  Cardiovascular: Normal rate, regular rhythm and normal heart sounds.   Pulmonary/Chest: Effort normal and breath sounds normal.  Musculoskeletal: Normal range of motion.  Skin: Skin is warm and dry.  Psychiatric: He has a normal mood and affect.          Assessment & Plan:  Assessment: Otalgia, left otitis media  Plan: Z-Pak as directed. Ibuprofen when  necessary pain. Rest. Drink plenty of fluids. Encouraged an over-the-counter antihistamine decongestant. Patient to call if symptoms worsen or persist. Recheck her scheduled, and when necessary.

## 2011-03-22 ENCOUNTER — Other Ambulatory Visit: Payer: Self-pay | Admitting: Internal Medicine

## 2011-03-27 ENCOUNTER — Telehealth: Payer: Self-pay | Admitting: Internal Medicine

## 2011-03-27 MED ORDER — ACYCLOVIR 5 % EX CREA: 1.0000 "application " | TOPICAL_CREAM | Freq: Three times a day (TID) | CUTANEOUS | Status: DC

## 2011-03-27 NOTE — Telephone Encounter (Signed)
Pharmacy called and this cost pt $300 can he have something equilvant to this.

## 2011-03-27 NOTE — Telephone Encounter (Signed)
Pt would like zovirax 5% cream smallest tube [facility] [address] [phone]. If med is on back order please call into substitution

## 2011-03-27 NOTE — Telephone Encounter (Signed)
rx sent in electronically 

## 2011-03-28 NOTE — Telephone Encounter (Signed)
Notified pharmacy.

## 2011-03-28 NOTE — Telephone Encounter (Signed)
There is not a generic alternative

## 2011-05-14 ENCOUNTER — Other Ambulatory Visit: Payer: Self-pay | Admitting: Internal Medicine

## 2011-07-04 ENCOUNTER — Telehealth: Payer: Self-pay | Admitting: Internal Medicine

## 2011-07-04 MED ORDER — EPINEPHRINE 0.3 MG/0.3ML IJ DEVI: 0.3000 mg | Freq: Once | INTRAMUSCULAR | Status: DC

## 2011-07-04 NOTE — Telephone Encounter (Signed)
rx sent in electronically 

## 2011-07-04 NOTE — Telephone Encounter (Signed)
Pt called and is needing to get a renewal on script for Eppipen. Pt is leaving to go out of town and needs to pick up script at pharmacy today. [facility]. Pt is allergic to bees and needs to take this on trip. Pt aware that pcp is out of office. Pt req to be called when this has been done.

## 2012-01-11 ENCOUNTER — Other Ambulatory Visit: Payer: Self-pay | Admitting: Internal Medicine

## 2012-01-18 ENCOUNTER — Telehealth: Payer: Self-pay | Admitting: Internal Medicine

## 2012-01-18 NOTE — Telephone Encounter (Signed)
Pt needs OV 

## 2012-01-18 NOTE — Telephone Encounter (Addendum)
Called pt and notified him that pt will need to sch ov per Dr Cato Mulligan. Pt has req to get biometric screening done and will sch with Adline Mango.

## 2012-01-18 NOTE — Telephone Encounter (Signed)
Pt called req refills on imipramine (TOFRANIL) 10 MG tablet and buPROPion (WELLBUTRIN XL) 300 MG 24 hr [facility] [address]. Pt says that he does not need ov to get this filled because he's been taking this med for many years.

## 2012-01-22 ENCOUNTER — Encounter: Payer: BC Managed Care – PPO | Admitting: Family

## 2012-01-23 ENCOUNTER — Ambulatory Visit (INDEPENDENT_AMBULATORY_CARE_PROVIDER_SITE_OTHER): Payer: BC Managed Care – PPO | Admitting: Family

## 2012-01-23 ENCOUNTER — Encounter: Payer: Self-pay | Admitting: Family

## 2012-01-23 VITALS — BP 118/80 | HR 74 | Ht 76.0 in | Wt 196.0 lb

## 2012-01-23 DIAGNOSIS — Z7251 High risk heterosexual behavior: Secondary | ICD-10-CM

## 2012-01-23 DIAGNOSIS — F411 Generalized anxiety disorder: Secondary | ICD-10-CM

## 2012-01-23 DIAGNOSIS — Z0000 Encounter for general adult medical examination without abnormal findings: Secondary | ICD-10-CM

## 2012-01-23 DIAGNOSIS — Z23 Encounter for immunization: Secondary | ICD-10-CM

## 2012-01-23 DIAGNOSIS — F419 Anxiety disorder, unspecified: Secondary | ICD-10-CM

## 2012-01-23 LAB — CBC WITH DIFFERENTIAL/PLATELET
Basophils Absolute: 0 10*3/uL (ref 0.0–0.1)
HCT: 45.6 % (ref 39.0–52.0)
Lymphs Abs: 1.3 10*3/uL (ref 0.7–4.0)
Monocytes Absolute: 0.5 10*3/uL (ref 0.1–1.0)
Monocytes Relative: 8.8 % (ref 3.0–12.0)
Platelets: 200 10*3/uL (ref 150.0–400.0)
RDW: 13.4 % (ref 11.5–14.6)

## 2012-01-23 LAB — COMPREHENSIVE METABOLIC PANEL
Albumin: 4.6 g/dL (ref 3.5–5.2)
Alkaline Phosphatase: 51 U/L (ref 39–117)
BUN: 22 mg/dL (ref 6–23)
Creatinine, Ser: 1.1 mg/dL (ref 0.4–1.5)
Glucose, Bld: 93 mg/dL (ref 70–99)
Total Bilirubin: 0.8 mg/dL (ref 0.3–1.2)

## 2012-01-23 LAB — POCT URINALYSIS DIPSTICK
Bilirubin, UA: NEGATIVE
Glucose, UA: NEGATIVE
Leukocytes, UA: NEGATIVE
Nitrite, UA: NEGATIVE
Urobilinogen, UA: 0.2

## 2012-01-23 LAB — LIPID PANEL
Cholesterol: 230 mg/dL — ABNORMAL HIGH (ref 0–200)
Total CHOL/HDL Ratio: 4
Triglycerides: 66 mg/dL (ref 0.0–149.0)

## 2012-01-23 MED ORDER — BUPROPION HCL ER (XL) 300 MG PO TB24: 300.0000 mg | ORAL_TABLET | ORAL | Status: DC

## 2012-01-23 MED ORDER — IMIPRAMINE HCL 10 MG PO TABS: 10.0000 mg | ORAL_TABLET | Freq: Every day | ORAL | Status: DC

## 2012-01-23 NOTE — Progress Notes (Signed)
Subjective:    Patient ID: Jose Reyes, male    DOB: 1975/08/22, 36 y.o.   MRN: [id number]  HPI Past Medical History  Diagnosis Date  . Cancer   . Testicular cancer     History   Social History  . Marital Status: Single    Spouse Name: N/A    Number of Children: N/A  . Years of Education: N/A   Occupational History  . Not on file.   Social History Main Topics  . Smoking status: Never Smoker   . Smokeless tobacco: Never Used  . Alcohol Use: Yes  . Drug Use: No  . Sexually Active: Not on file   Other Topics Concern  . Not on file   Social History Narrative  . No narrative on file    Past Surgical History  Procedure Date  . Joint replacement     No family history on file.  Allergies  Allergen Reactions  . Penicillins     REACTION: unknown    Current Outpatient Prescriptions on File Prior to Visit  Medication Sig Dispense Refill  . acyclovir cream (ZOVIRAX) 5 % Apply 1 application topically 3 (three) times daily.  5 g  5  . EPINEPHrine (EPIPEN 2-PAK) 0.3 mg/0.3 mL DEVI Inject 0.3 mLs (0.3 mg total) into the muscle once.  1 Device  2  . ibuprofen (ADVIL,MOTRIN) 200 MG tablet Take 200 mg by mouth every 6 (six) hours as needed.        Marland Kitchen omeprazole (PRILOSEC OTC) 20 MG tablet Take 20 mg by mouth daily.        . [DISCONTINUED] buPROPion (WELLBUTRIN XL) 300 MG 24 hr tablet TAKE 1 TABLET BY MOUTH EVERY MORNING  30 tablet  2  . [DISCONTINUED] imipramine (TOFRANIL) 10 MG tablet TAKE 1 TABLET EVERY 2-3 NIGHTS AS NEEDED FOR SLEEP  60 tablet  0  . meloxicam (MOBIC) 15 MG tablet Take 15 mg by mouth daily.          BP 118/80  Pulse 74  Ht 6\' 4"  (1.93 m)  Wt 196 lb (88.905 kg)  BMI 23.86 kg/m2  SpO2 97%chart   Review of Systems  Constitutional: Negative.   HENT: Negative.   Eyes: Negative.   Respiratory: Negative.   Cardiovascular: Negative.   Gastrointestinal: Negative.   Genitourinary: Negative.   Musculoskeletal: Negative.   Skin: Negative.     Neurological: Negative.   Hematological: Negative.   Psychiatric/Behavioral: Negative.        Objective:   Physical Exam  Constitutional: He is oriented to person, place, and time. He appears well-developed and well-nourished.  HENT:  Head: Normocephalic and atraumatic.  Right Ear: External ear normal.  Left Ear: External ear normal.  Nose: Nose normal.  Mouth/Throat: Oropharynx is clear and moist.  Eyes: Conjunctivae normal and EOM are normal. Pupils are equal, round, and reactive to light.  Neck: Normal range of motion. Neck supple.  Cardiovascular: Normal rate, regular rhythm, normal heart sounds and intact distal pulses.   Pulmonary/Chest: Effort normal and breath sounds normal.  Abdominal: Soft. Bowel sounds are normal.  Genitourinary: Penis normal.  Musculoskeletal: Normal range of motion.  Neurological: He is alert and oriented to person, place, and time. He has normal reflexes.  Skin: Skin is warm and dry.  Psychiatric: He has a normal mood and affect.          Assessment & Plan:  Assessment: CPX, Anxiety, High Risk Sexual Behavior  Plan: Labs sent. Meds refilled.  Call the office with any questions or concerns. Recheck in 1 year and sooner as needed.

## 2012-01-23 NOTE — Patient Instructions (Addendum)
Exercise to Stay Healthy Exercise helps you become and stay healthy. EXERCISE IDEAS AND TIPS Choose exercises that:  You enjoy.  Fit into your day. You do not need to exercise really hard to be healthy. You can do exercises at a slow or medium level and stay healthy. You can:  Stretch before and after working out.  Try yoga, Pilates, or tai chi.  Lift weights.  Walk fast, swim, jog, run, climb stairs, bicycle, dance, or rollerskate.  Take aerobic classes. Exercises that burn about 150 calories:  Running 1  miles in 15 minutes.  Playing volleyball for 45 to 60 minutes.  Washing and waxing a car for 45 to 60 minutes.  Playing touch football for 45 minutes.  Walking 1  miles in 35 minutes.  Pushing a stroller 1  miles in 30 minutes.  Playing basketball for 30 minutes.  Raking leaves for 30 minutes.  Bicycling 5 miles in 30 minutes.  Walking 2 miles in 30 minutes.  Dancing for 30 minutes.  Shoveling snow for 15 minutes.  Swimming laps for 20 minutes.  Walking up stairs for 15 minutes.  Bicycling 4 miles in 15 minutes.  Gardening for 30 to 45 minutes.  Jumping rope for 15 minutes.  Washing windows or floors for 45 to 60 minutes. Document Released: 03/31/2010 Document Revised: 05/21/2011 Document Reviewed: 03/31/2010 [employer] Patient Information 2013 [employer].  

## 2012-01-24 LAB — GC/CHLAMYDIA PROBE AMP, URINE
Chlamydia, Swab/Urine, PCR: NEGATIVE
GC Probe Amp, Urine: NEGATIVE

## 2012-01-24 LAB — RPR

## 2012-01-24 LAB — HSV 2 ANTIBODY, IGG: HSV 2 Glycoprotein G Ab, IgG: <0.1 IV

## 2012-01-24 LAB — HIV ANTIBODY (ROUTINE TESTING W REFLEX): HIV: NONREACTIVE

## 2012-01-25 ENCOUNTER — Telehealth: Payer: Self-pay

## 2012-01-25 NOTE — Telephone Encounter (Signed)
Pt left message with questions about labs and requesting a copy. Returned call to pt. Left message to advise pt to return call before 4:45 today or Monday after 8:15am. Also advised pt that he can sign up for MyChart to view labs. Copy mailed to pt

## 2012-02-19 ENCOUNTER — Telehealth: Payer: Self-pay | Admitting: Internal Medicine

## 2012-02-19 NOTE — Telephone Encounter (Signed)
Patient called stating that he woke up with an unusual lump under the ball of his foot and would like to be referred to a podiatrist. Please assist.

## 2012-02-20 NOTE — Telephone Encounter (Signed)
L/m on pts cell phone to call [facility]

## 2012-02-20 NOTE — Telephone Encounter (Signed)
He can call [facility]

## 2012-03-19 ENCOUNTER — Ambulatory Visit (INDEPENDENT_AMBULATORY_CARE_PROVIDER_SITE_OTHER): Payer: BC Managed Care – PPO | Admitting: Family Medicine

## 2012-03-19 ENCOUNTER — Encounter: Payer: Self-pay | Admitting: Family Medicine

## 2012-03-19 VITALS — BP 120/76 | Temp 98.1°F | Wt 194.0 lb

## 2012-03-19 DIAGNOSIS — K5900 Constipation, unspecified: Secondary | ICD-10-CM

## 2012-03-19 DIAGNOSIS — K649 Unspecified hemorrhoids: Secondary | ICD-10-CM

## 2012-03-19 MED ORDER — HYDROCORTISONE 2.5 % RE CREA: TOPICAL_CREAM | Freq: Two times a day (BID) | RECTAL | Status: DC

## 2012-03-19 NOTE — Patient Instructions (Addendum)
Hemorrhoids Hemorrhoids are enlarged (dilated) veins around the rectum. There are 2 types of hemorrhoids, and the type of hemorrhoid is determined by its location. Internal hemorrhoids occur in the veins just inside the rectum.They are usually not painful, but they may bleed.However, they may poke through to the outside and become irritated and painful. External hemorrhoids involve the veins outside the anus and can be felt as a painful swelling or hard lump near the anus.They are often itchy and may crack and bleed. Sometimes clots will form in the veins. This makes them swollen and painful. These are called thrombosed hemorrhoids. CAUSES Causes of hemorrhoids include:  Pregnancy. This increases the pressure in the hemorrhoidal veins.  Constipation.  Straining to have a bowel movement.  Obesity.  Heavy lifting or other activity that caused you to strain. TREATMENT Most of the time hemorrhoids improve in 1 to 2 weeks. However, if symptoms do not seem to be getting better or if you have a lot of rectal bleeding, your caregiver may perform a procedure to help make the hemorrhoids get smaller or remove them completely.Possible treatments include:  Rubber band ligation. A rubber band is placed at the base of the hemorrhoid to cut off the circulation.  Sclerotherapy. A chemical is injected to shrink the hemorrhoid.  Infrared light therapy. Tools are used to burn the hemorrhoid.  Hemorrhoidectomy. This is surgical removal of the hemorrhoid. HOME CARE INSTRUCTIONS   Increase fiber in your diet. Ask your caregiver about using fiber supplements. START A DAILY FIBER SUPPLEMENT.  Drink enough water and fluids to keep your urine clear or pale yellow.  Exercise regularly.  Go to the bathroom when you have the urge to have a bowel movement. Do not wait.  Avoid straining to have bowel movements.  Keep the anal area dry and clean.  Only take over-the-counter or prescription medicines for  pain, discomfort, or fever as directed by your caregiver. If your hemorrhoids are thrombosed:  Take warm sitz baths for 20 to 30 minutes, 3 to 4 times per day.  If the hemorrhoids are very tender and swollen, place ice packs on the area as tolerated. Using ice packs between sitz baths may be helpful. Fill a plastic bag with ice. Place a towel between the bag of ice and your skin.  Medicated creams and suppositories may be used or applied as directed.  Do not use a donut-shaped pillow or sit on the toilet for long periods. This increases blood pooling and pain. SEEK MEDICAL CARE IF:   You have increasing pain and swelling that is not controlled with your medicine.  You have uncontrolled bleeding.  You have difficulty or you are unable to have a bowel movement.  You have pain or inflammation outside the area of the hemorrhoids.  You have chills or an oral temperature above 102 F (38.9 C). MAKE SURE YOU:   Understand these instructions.  Will watch your condition.  Will get help right away if you are not doing well or get worse. Document Released: 02/24/2000 Document Revised: 05/21/2011 Document Reviewed: 02/06/2010 [facility] Patient Information 2013 [place], [place].

## 2012-03-19 NOTE — Progress Notes (Signed)
Chief Complaint  Patient presents with  . Rectal Pain    soreness    HPI:  Rectal Pain: -started 4-5 days ago -symptoms: red bump near rectum, painful initially - now no pain -denies: fevers, draining, bleeding, change in bowels - sexually active with girlfriend (one partner) -hx constipation, does have straining and hard stools at times, tx in the past and used to take fiber supplement  ROS: See pertinent positives and negatives per HPI.  Past Medical History  Diagnosis Date  . Cancer   . Testicular cancer     No family history on file.  History   Social History  . Marital Status: Single    Spouse Name: N/A    Number of Children: N/A  . Years of Education: N/A   Social History Main Topics  . Smoking status: Never Smoker   . Smokeless tobacco: Never Used  . Alcohol Use: Yes  . Drug Use: No  . Sexually Active: None   Other Topics Concern  . None   Social History Narrative  . None    Current outpatient prescriptions:acyclovir cream (ZOVIRAX) 5 %, Apply 1 application topically 3 (three) times daily., Disp: 5 g, Rfl: 5;  buPROPion (WELLBUTRIN XL) 300 MG 24 hr tablet, Take 1 tablet (300 mg total) by mouth every morning., Disp: 30 tablet, Rfl: 4;  EPINEPHrine (EPIPEN 2-PAK) 0.3 mg/0.3 mL DEVI, Inject 0.3 mLs (0.3 mg total) into the muscle once., Disp: 1 Device, Rfl: 2 ibuprofen (ADVIL,MOTRIN) 200 MG tablet, Take 200 mg by mouth every 6 (six) hours as needed.  , Disp: , Rfl: ;  imipramine (TOFRANIL) 10 MG tablet, Take 1 tablet (10 mg total) by mouth at bedtime., Disp: 60 tablet, Rfl: 5;  omeprazole (PRILOSEC OTC) 20 MG tablet, Take 20 mg by mouth daily.  , Disp: , Rfl: ;  hydrocortisone (PROCTOSOL HC) 2.5 % rectal cream, Place rectally 2 (two) times daily., Disp: 30 g, Rfl: 0 meloxicam (MOBIC) 15 MG tablet, Take 15 mg by mouth daily.  , Disp: , Rfl:   EXAM:  Filed Vitals:   03/19/12 1100  BP: 120/76  Temp: 98.1 F (36.7 C)    There is no height on file to  calculate BMI.  GENERAL: vitals reviewed and listed above, alert, oriented, appears well hydrated and in no acute distress  RECTUM: 1 small external hemorrhoid  MS: moves all extremities without noticeable abnormality  PSYCH: pleasant and cooperative, no obvious depression or anxiety  ASSESSMENT AND PLAN:  Discussed the following assessment and plan:  1. Hemorrhoid  -discussed tx options -will tx constipation, top steroid, sitz baths, follow up with PCP if not resolving as expected hydrocortisone (PROCTOSOL HC) 2.5 % rectal cream  2. Constipation  Fiber supplement, education    -Patient advised to return or notify a doctor immediately if symptoms worsen or persist or new concerns arise.  Patient Instructions  Hemorrhoids Hemorrhoids are enlarged (dilated) veins around the rectum. There are 2 types of hemorrhoids, and the type of hemorrhoid is determined by its location. Internal hemorrhoids occur in the veins just inside the rectum.They are usually not painful, but they may bleed.However, they may poke through to the outside and become irritated and painful. External hemorrhoids involve the veins outside the anus and can be felt as a painful swelling or hard lump near the anus.They are often itchy and may crack and bleed. Sometimes clots will form in the veins. This makes them swollen and painful. These are called thrombosed hemorrhoids. CAUSES  Causes of hemorrhoids include:  Pregnancy. This increases the pressure in the hemorrhoidal veins.  Constipation.  Straining to have a bowel movement.  Obesity.  Heavy lifting or other activity that caused you to strain. TREATMENT Most of the time hemorrhoids improve in 1 to 2 weeks. However, if symptoms do not seem to be getting better or if you have a lot of rectal bleeding, your caregiver may perform a procedure to help make the hemorrhoids get smaller or remove them completely.Possible treatments include:  Rubber band ligation.  A rubber band is placed at the base of the hemorrhoid to cut off the circulation.  Sclerotherapy. A chemical is injected to shrink the hemorrhoid.  Infrared light therapy. Tools are used to burn the hemorrhoid.  Hemorrhoidectomy. This is surgical removal of the hemorrhoid. HOME CARE INSTRUCTIONS   Increase fiber in your diet. Ask your caregiver about using fiber supplements. START A DAILY FIBER SUPPLEMENT.  Drink enough water and fluids to keep your urine clear or pale yellow.  Exercise regularly.  Go to the bathroom when you have the urge to have a bowel movement. Do not wait.  Avoid straining to have bowel movements.  Keep the anal area dry and clean.  Only take over-the-counter or prescription medicines for pain, discomfort, or fever as directed by your caregiver. If your hemorrhoids are thrombosed:  Take warm sitz baths for 20 to 30 minutes, 3 to 4 times per day.  If the hemorrhoids are very tender and swollen, place ice packs on the area as tolerated. Using ice packs between sitz baths may be helpful. Fill a plastic bag with ice. Place a towel between the bag of ice and your skin.  Medicated creams and suppositories may be used or applied as directed.  Do not use a donut-shaped pillow or sit on the toilet for long periods. This increases blood pooling and pain. SEEK MEDICAL CARE IF:   You have increasing pain and swelling that is not controlled with your medicine.  You have uncontrolled bleeding.  You have difficulty or you are unable to have a bowel movement.  You have pain or inflammation outside the area of the hemorrhoids.  You have chills or an oral temperature above 102 F (38.9 C). MAKE SURE YOU:   Understand these instructions.  Will watch your condition.  Will get help right away if you are not doing well or get worse. Document Released: 02/24/2000 Document Revised: 05/21/2011 Document Reviewed: 02/06/2010 [facility] Patient Information 2013 [place],  [place], [place]

## 2012-07-01 ENCOUNTER — Telehealth: Payer: Self-pay | Admitting: Internal Medicine

## 2012-07-01 NOTE — Telephone Encounter (Signed)
Patient calling about medication issue, left phone number [phone] for return call to discuss this issue. Attempted call to patient, no answer - left message for patient to call us back.

## 2012-07-01 NOTE — Telephone Encounter (Signed)
Caller: Jan/Patient; PCP: Swords,Bruce; CB#: [phone]. 07/01/12 - Patient called to see if Dr. 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 The 5% Zovirax Cream that he had been using (last prescription was written on 03/27/2011) did not really work and he has been trying OTC Medications that are not working well either. It was suggested to him that Valtrex Cream might work better for him, so he wanted to see if Dr. Cato Mulligan thought this was a good ides and call in a prescription for him. [facility] [address] @ [phone] and would like it called in today if possible. Please call him at [phone] and let him know if/when Dr. Cato Mulligan has it called in to the pharmacy.

## 2012-07-02 ENCOUNTER — Telehealth: Payer: Self-pay | Admitting: Internal Medicine

## 2012-07-02 MED ORDER — VALACYCLOVIR HCL 1 G PO TABS: 1000.0000 mg | ORAL_TABLET | Freq: Three times a day (TID) | ORAL | Status: DC

## 2012-07-02 NOTE — Telephone Encounter (Signed)
duplicate

## 2012-07-02 NOTE — Telephone Encounter (Signed)
Valtrex 1000mg  po tid for 5 days

## 2012-07-02 NOTE — Telephone Encounter (Signed)
Caller: Jose Reyes/Patient; Phone: [phone]; Reason for Call: Returning office call

## 2012-07-02 NOTE — Telephone Encounter (Signed)
rx sent in electronically, pt aware 

## 2012-07-09 ENCOUNTER — Other Ambulatory Visit: Payer: Self-pay | Admitting: *Deleted

## 2012-07-09 MED ORDER — IMIPRAMINE HCL 10 MG PO TABS: 10.0000 mg | ORAL_TABLET | Freq: Every day | ORAL | Status: DC

## 2012-07-25 ENCOUNTER — Other Ambulatory Visit: Payer: Self-pay | Admitting: Internal Medicine

## 2012-08-01 ENCOUNTER — Other Ambulatory Visit: Payer: Self-pay | Admitting: Family

## 2012-10-15 ENCOUNTER — Encounter: Payer: Self-pay | Admitting: Family Medicine

## 2012-10-15 ENCOUNTER — Telehealth: Payer: Self-pay | Admitting: Internal Medicine

## 2012-10-15 ENCOUNTER — Ambulatory Visit (INDEPENDENT_AMBULATORY_CARE_PROVIDER_SITE_OTHER): Payer: BC Managed Care – PPO | Admitting: Family Medicine

## 2012-10-15 VITALS — BP 148/80 | HR 92 | Temp 98.5°F | Wt 195.0 lb

## 2012-10-15 DIAGNOSIS — K602 Anal fissure, unspecified: Secondary | ICD-10-CM

## 2012-10-15 DIAGNOSIS — S20229A Contusion of unspecified back wall of thorax, initial encounter: Secondary | ICD-10-CM
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
## 2012-10-15 NOTE — Telephone Encounter (Signed)
Patient Information:  Caller Name: Jose  Phone: [phone]  Patient: Jose Reyes, Jose Reyes  Gender: Male  DOB: 1975/05/06  Age: 37 Years  PCP: Birdie Sons (Adults only)  Office Follow Up:  Does the office need to follow up with this patient?: No  Instructions For The Office: N/A   Symptoms  Reason For Call & Symptoms: Jose states he thinks he may have a bone spur on middle of spine above waist . Rates slight pain a 1 on 1/10 pt scale. Thinks he may have a bone spur. Asking if he needs an ultrasound and does he need a referral to a specialist? Went wake boarding on 10/14/12. Declined triage for back pain and will discuss with physician when seen in office today 10/15/12.  Has had red rectal bleeding x one on 10/14/12 and once on 10/15/12.States it was more than just a few drops of blood. Per rectal bleeding protocol has see today in office disposition due to all other patients with rectal bleeding . Appointment scheduled in [facility] for 1545 on 10/15/12 with Dr. Clent Ridges.  Reviewed Health History In EMR: Yes  Reviewed Medications In EMR: Yes  Reviewed Allergies In EMR: Yes  Reviewed Surgeries / Procedures: Yes  Date of Onset of Symptoms: 09/14/2012  Guideline(s) Used:  Back Pain  Rectal Bleeding  Disposition Per Guideline:   See Today in Office  Reason For Disposition Reached:   All other patients with rectal bleeding (Exceptions: blood just on toilet paper, few drops, streaks on surface of normal formed BM)  Advice Given:  N/A  Patient Will Follow Care Advice:  YES  Appointment Scheduled:  10/15/2012 15:45:00 Appointment Scheduled Provider:  Gershon Crane [facility])

## 2012-10-16 ENCOUNTER — Encounter: Payer: Self-pay | Admitting: Family Medicine

## 2013-01-07 ENCOUNTER — Other Ambulatory Visit: Payer: Self-pay | Admitting: Internal Medicine

## 2013-01-15 ENCOUNTER — Other Ambulatory Visit: Payer: Self-pay

## 2013-01-17 ENCOUNTER — Other Ambulatory Visit: Payer: Self-pay | Admitting: Internal Medicine

## 2013-03-26 ENCOUNTER — Ambulatory Visit (INDEPENDENT_AMBULATORY_CARE_PROVIDER_SITE_OTHER): Payer: BC Managed Care – PPO | Admitting: Family Medicine

## 2013-03-26 ENCOUNTER — Encounter: Payer: Self-pay | Admitting: Family Medicine

## 2013-03-26 VITALS — BP 142/80 | HR 80 | Temp 98.1°F | Wt 200.0 lb

## 2013-03-26 DIAGNOSIS — K645 Perianal venous thrombosis: Secondary | ICD-10-CM

## 2013-03-26 NOTE — Progress Notes (Signed)
   Subjective:    Patient ID: Jose Reyes, male    DOB: [birth year], 38 y.o.   MRN: [id number]  HPI Here for 3 days of a painful lump at the anus. No bleeding. BMs are normal.    Review of Systems  Constitutional: Negative.   Gastrointestinal: Positive for rectal pain. Negative for abdominal pain, diarrhea, constipation, blood in stool and anal bleeding.       Objective:   Physical Exam  Constitutional: He appears well-developed and well-nourished.  Genitourinary:  There is a thrombosed external hemorrhoid           Assessment & Plan:  The hemorrhoid was lanced with a scalpel and the thrombus was extracted. appy Preparation H prn

## 2013-03-26 NOTE — Progress Notes (Signed)
Pre visit review using our clinic review tool, if applicable. No additional management support is needed unless otherwise documented below in the visit note. 

## 2013-05-25 ENCOUNTER — Other Ambulatory Visit: Payer: Self-pay | Admitting: Family

## 2013-05-25 ENCOUNTER — Other Ambulatory Visit: Payer: Self-pay | Admitting: *Deleted

## 2013-05-25 MED ORDER — IMIPRAMINE HCL 10 MG PO TABS: 10.0000 mg | ORAL_TABLET | Freq: Every day | ORAL | Status: DC

## 2013-05-25 NOTE — Telephone Encounter (Addendum)
Pt stated he does not need appt. Pt said he has been taking this med his whole life. Pt last appt with dr Sarajane Jews [phone]

## 2013-05-25 NOTE — Telephone Encounter (Addendum)
Pt continues to insist he does not need appt. Transferred back to cindy

## 2013-05-25 NOTE — Telephone Encounter (Signed)
Pt hasn't been seen by Dr Leanne Chang since 02/27/2010.  His last CPX was 01/23/12 with Padonda.  He seen Dr Sarajane Jews for an acute appt, does not count.  Medical board requires a pt to be seen at least once a year in order to renew meds.  So he needs to make an appt with Dr Leanne Chang.  I will give him enough to get him through his appt.

## 2013-06-17 ENCOUNTER — Telehealth: Payer: Self-pay | Admitting: Internal Medicine

## 2013-06-17 MED ORDER — LISDEXAMFETAMINE DIMESYLATE 30 MG PO CAPS: 30.0000 mg | ORAL_CAPSULE | Freq: Every day | ORAL | Status: DC

## 2013-06-17 NOTE — Telephone Encounter (Signed)
Pt has been on adhd med in past. Pt has appt on 07-17-13 and would like a new rx vyvanse?mg

## 2013-06-17 NOTE — Telephone Encounter (Signed)
Not on med list

## 2013-06-18 MED ORDER — LISDEXAMFETAMINE DIMESYLATE 30 MG PO CAPS
30.0000 mg | ORAL_CAPSULE | Freq: Every day | ORAL | Status: DC
Start: 2013-06-18 — End: 2013-06-22

## 2013-06-22 MED ORDER — LISDEXAMFETAMINE DIMESYLATE 30 MG PO CAPS: 30.0000 mg | ORAL_CAPSULE | Freq: Every day | ORAL | Status: DC

## 2013-06-22 NOTE — Telephone Encounter (Signed)
rx up front for p/u, pt aware

## 2013-07-17 ENCOUNTER — Ambulatory Visit (INDEPENDENT_AMBULATORY_CARE_PROVIDER_SITE_OTHER): Payer: BC Managed Care – PPO | Admitting: Family

## 2013-07-17 ENCOUNTER — Encounter: Payer: Self-pay | Admitting: Family

## 2013-07-17 ENCOUNTER — Ambulatory Visit: Payer: BC Managed Care – PPO | Admitting: Internal Medicine

## 2013-07-17 VITALS — BP 144/88 | HR 74 | Ht 76.0 in | Wt 203.0 lb

## 2013-07-17 DIAGNOSIS — Z0000 Encounter for general adult medical examination without abnormal findings: Secondary | ICD-10-CM

## 2013-07-17 DIAGNOSIS — F411 Generalized anxiety disorder: Secondary | ICD-10-CM

## 2013-07-17 MED ORDER — SERTRALINE HCL 50 MG PO TABS: 50.0000 mg | ORAL_TABLET | Freq: Every day | ORAL | Status: DC

## 2013-07-17 NOTE — Progress Notes (Signed)
Subjective:    Patient ID: Jose Reyes, male    DOB: 1975/08/24, 38 y.o.   MRN: [id number]  HPI  38 year old WM, nonsmoker, is in today for a CPX. He reports not filling RX for Vyvanse due to cost but feel like he is more anxious and would like to try Zoloft. Reports snacking at night.   Review of Systems  Constitutional: Negative.   HENT: Negative.   Eyes: Negative.   Respiratory: Negative.   Cardiovascular: Negative.   Gastrointestinal: Negative.   Endocrine: Negative.   Genitourinary: Negative.   Musculoskeletal: Negative.   Skin: Negative.   Allergic/Immunologic: Negative.   Neurological: Negative.   Hematological: Negative.   Psychiatric/Behavioral: Negative.    Past Medical History  Diagnosis Date  . Cancer   . Testicular cancer     History   Social History  . Marital Status: Single    Spouse Name: N/A    Number of Children: N/A  . Years of Education: N/A   Occupational History  . Not on file.   Social History Main Topics  . Smoking status: Never Smoker   . Smokeless tobacco: Never Used  . Alcohol Use: Yes     Comment: occ  . Drug Use: No  . Sexual Activity: Not on file   Other Topics Concern  . Not on file   Social History Narrative  . No narrative on file    Past Surgical History  Procedure Laterality Date  . Joint replacement      No family history on file.  Allergies  Allergen Reactions  . Penicillins     REACTION: unknown    Current Outpatient Prescriptions on File Prior to Visit  Medication Sig Dispense Refill  . acyclovir cream (ZOVIRAX) 5 % Apply 1 application topically 3 (three) times daily.  5 g  5  . EPINEPHrine (EPIPEN 2-PAK) 0.3 mg/0.3 mL DEVI Inject 0.3 mLs (0.3 mg total) into the muscle once.  1 Device  2  . hydrocortisone (PROCTOSOL HC) 2.5 % rectal cream Place rectally 2 (two) times daily.  30 g  0  . ibuprofen (ADVIL,MOTRIN) 200 MG tablet Take 200 mg by mouth every 6 (six) hours as needed.        Marland Kitchen imipramine  (TOFRANIL) 10 MG tablet Take 1 tablet (10 mg total) by mouth at bedtime.  30 tablet  2  . meloxicam (MOBIC) 15 MG tablet Take 15 mg by mouth daily.        Marland Kitchen omeprazole (PRILOSEC OTC) 20 MG tablet Take 20 mg by mouth daily.        . valACYclovir (VALTREX) 1000 MG tablet TAKE 1 TABLET BY MOUTH 3 TIMES A DAY  15 tablet  0   No current facility-administered medications on file prior to visit.    BP 144/88  Pulse 74  Ht 6\' 4"  (1.93 m)  Wt 203 lb (92.08 kg)  BMI 24.72 kg/m2chart    Objective:   Physical Exam  Constitutional: He is oriented to person, place, and time. He appears well-developed and well-nourished.  HENT:  Head: Normocephalic and atraumatic.  Right Ear: External ear normal.  Left Ear: External ear normal.  Nose: Nose normal.  Mouth/Throat: Oropharynx is clear and moist.  Eyes: Conjunctivae and EOM are normal. Pupils are equal, round, and reactive to light.  Neck: Normal range of motion. Neck supple. No thyromegaly present.  Cardiovascular: Normal rate, regular rhythm and normal heart sounds.   Pulmonary/Chest: Effort normal and  breath sounds normal.  Abdominal: Soft. Bowel sounds are normal.  Genitourinary: Penis normal.  Musculoskeletal: Normal range of motion.  Neurological: He is alert and oriented to person, place, and time. He has normal reflexes. No cranial nerve deficit. Coordination normal.  Skin: Skin is warm and dry.  Psychiatric: He has a normal mood and affect.          Assessment & Plan:  Jose was seen today for follow-up.  Diagnoses and associated orders for this visit:  Preventative health care - CMP; Future - Lipid Panel; Future - CBC with Differential; Future - TSH; Future - POC Urinalysis Dipstick  Anxiety state, unspecified  Other Orders - sertraline (ZOLOFT) 50 MG tablet; Take 1 tablet (50 mg total) by mouth daily.   Schedule lab only visit. Will follow-up pending labs.

## 2013-07-17 NOTE — Patient Instructions (Signed)

## 2013-07-24 ENCOUNTER — Other Ambulatory Visit: Payer: BC Managed Care – PPO

## 2013-07-29 ENCOUNTER — Other Ambulatory Visit: Payer: BC Managed Care – PPO

## 2013-08-12 ENCOUNTER — Ambulatory Visit: Payer: BC Managed Care – PPO | Admitting: Family

## 2013-08-12 DIAGNOSIS — Z0289 Encounter for other administrative examinations: Secondary | ICD-10-CM

## 2013-11-30 ENCOUNTER — Other Ambulatory Visit: Payer: Self-pay | Admitting: Family

## 2013-12-04 ENCOUNTER — Telehealth: Payer: Self-pay | Admitting: Family

## 2013-12-04 ENCOUNTER — Other Ambulatory Visit (INDEPENDENT_AMBULATORY_CARE_PROVIDER_SITE_OTHER): Payer: BC Managed Care – PPO

## 2013-12-04 ENCOUNTER — Ambulatory Visit (INDEPENDENT_AMBULATORY_CARE_PROVIDER_SITE_OTHER): Payer: BC Managed Care – PPO | Admitting: Physician Assistant

## 2013-12-04 ENCOUNTER — Encounter: Payer: Self-pay | Admitting: Physician Assistant

## 2013-12-04 VITALS — BP 120/80 | HR 65 | Temp 98.0°F | Resp 18 | Wt 205.2 lb

## 2013-12-04 DIAGNOSIS — R253 Fasciculation: Secondary | ICD-10-CM

## 2013-12-04 DIAGNOSIS — R259 Unspecified abnormal involuntary movements: Secondary | ICD-10-CM

## 2013-12-04 DIAGNOSIS — Z0000 Encounter for general adult medical examination without abnormal findings: Secondary | ICD-10-CM

## 2013-12-04 LAB — COMPREHENSIVE METABOLIC PANEL
ALBUMIN: 4.5 g/dL (ref 3.5–5.2)
ALT: 28 U/L (ref 0–53)
AST: 27 U/L (ref 0–37)
Alkaline Phosphatase: 53 U/L (ref 39–117)
BUN: 24 mg/dL — ABNORMAL HIGH (ref 6–23)
CALCIUM: 9.4 mg/dL (ref 8.4–10.5)
CHLORIDE: 106 meq/L (ref 96–112)
CO2: 25 meq/L (ref 19–32)
CREATININE: 1 mg/dL (ref 0.4–1.5)
GFR: 89.9 mL/min (ref >60.00)
GLUCOSE: 98 mg/dL (ref 70–99)
POTASSIUM: 4.9 meq/L (ref 3.5–5.1)
Sodium: 139 mEq/L (ref 135–145)
Total Bilirubin: 0.7 mg/dL (ref 0.2–1.2)
Total Protein: 7.4 g/dL (ref 6.0–8.3)

## 2013-12-04 LAB — CBC WITH DIFFERENTIAL/PLATELET
Basophils Absolute: 0 10*3/uL (ref 0.0–0.1)
Basophils Relative: 0.4 % (ref 0.0–3.0)
EOS ABS: 0.1 10*3/uL (ref 0.0–0.7)
Eosinophils Relative: 1.4 % (ref 0.0–5.0)
HEMATOCRIT: 43 % (ref 39.0–52.0)
HEMOGLOBIN: 14.7 g/dL (ref 13.0–17.0)
LYMPHS ABS: 1.3 10*3/uL (ref 0.7–4.0)
LYMPHS PCT: 24.2 % (ref 12.0–46.0)
MCHC: 34.2 g/dL (ref 30.0–36.0)
MCV: 91.6 fl (ref 78.0–100.0)
Monocytes Absolute: 0.5 10*3/uL (ref 0.1–1.0)
Monocytes Relative: 9.2 % (ref 3.0–12.0)
NEUTROS ABS: 3.4 10*3/uL (ref 1.4–7.7)
Neutrophils Relative %: 64.8 % (ref 43.0–77.0)
Platelets: 198 10*3/uL (ref 150.0–400.0)
RBC: 4.69 Mil/uL (ref 4.22–5.81)
RDW: 13.2 % (ref 11.5–15.5)
WBC: 5.2 10*3/uL (ref 4.0–10.5)

## 2013-12-04 LAB — LIPID PANEL
CHOL/HDL RATIO: 5
CHOLESTEROL: 223 mg/dL — AB (ref 0–200)
HDL: 46.4 mg/dL (ref >39.00)
LDL Cholesterol: 158 mg/dL — ABNORMAL HIGH (ref 0–99)
NonHDL: 176.6
Triglycerides: 94 mg/dL (ref 0.0–149.0)
VLDL: 18.8 mg/dL (ref 0.0–40.0)

## 2013-12-04 LAB — TSH: TSH: 0.98 u[IU]/mL (ref 0.35–4.50)

## 2013-12-04 NOTE — Progress Notes (Signed)
Pre visit review using our clinic review tool, if applicable. No additional management support is needed unless otherwise documented below in the visit note. 

## 2013-12-04 NOTE — Patient Instructions (Signed)
If emergency symptoms discussed during visit developed, seek medical attention immediately.  Followup as needed, or for worsening or persistent symptoms despite treatment.

## 2013-12-04 NOTE — Telephone Encounter (Signed)
Pt request refill of the following: imipramine (TOFRANIL) 10 MG tablet    Phamacy:  [facility]

## 2013-12-04 NOTE — Progress Notes (Signed)
Subjective:    Patient ID: Jose Reyes, male    DOB: 1975-10-29, 38 y.o.   MRN: [id number]  HPI Patient is a 38 y.o. male presenting for chest muscle twitching. Pt states that it started yesterday, the pt was sitting at home. He states that he noticed a Twitching, pulsating sensation on and off involving the left upper chest/pec area, just below the clavicle. There was no pain, and that the episodes would last for a few minutes, with intermittent pauses. Pt has not try anything to relieve this. He states that he noticed this throughout the evening. He states that today it has not re-occurred. Patient denies fevers, chills, nausea, vomiting, diarrhea, shortness of breath, chestpain, DOE, headache, syncope, bruising, numbness, or tingling.    Review of Systems As per HPI and are otherwise negative.   Past Medical History  Diagnosis Date  . Cancer   . Testicular cancer     History   Social History  . Marital Status: Single    Spouse Name: N/A    Number of Children: N/A  . Years of Education: N/A   Occupational History  . Not on file.   Social History Main Topics  . Smoking status: Never Smoker   . Smokeless tobacco: Never Used  . Alcohol Use: Yes     Comment: occ  . Drug Use: No  . Sexual Activity: Not on file   Other Topics Concern  . Not on file   Social History Narrative  . No narrative on file    Past Surgical History  Procedure Laterality Date  . Joint replacement      No family history on file.  Allergies  Allergen Reactions  . Penicillins     REACTION: unknown    Current Outpatient Prescriptions on File Prior to Visit  Medication Sig Dispense Refill  . acyclovir cream (ZOVIRAX) 5 % Apply 1 application topically 3 (three) times daily.  5 g  5  . EPINEPHrine (EPIPEN 2-PAK) 0.3 mg/0.3 mL DEVI Inject 0.3 mLs (0.3 mg total) into the muscle once.  1 Device  2  . hydrocortisone (PROCTOSOL HC) 2.5 % rectal cream Place rectally 2 (two) times daily.  30 g   0  . ibuprofen (ADVIL,MOTRIN) 200 MG tablet Take 200 mg by mouth every 6 (six) hours as needed.        Marland Kitchen imipramine (TOFRANIL) 10 MG tablet Take 1 tablet (10 mg total) by mouth at bedtime.  30 tablet  2  . meloxicam (MOBIC) 15 MG tablet Take 15 mg by mouth daily.        Marland Kitchen omeprazole (PRILOSEC OTC) 20 MG tablet Take 20 mg by mouth daily.        . sertraline (ZOLOFT) 50 MG tablet TAKE 1 TABLET EVERY DAY  30 tablet  3  . valACYclovir (VALTREX) 1000 MG tablet TAKE 1 TABLET BY MOUTH 3 TIMES A DAY  15 tablet  0   No current facility-administered medications on file prior to visit.    EXAM: BP 120/80  Pulse 65  Temp(Src) 98 F (36.7 C) (Oral)  Resp 18  Wt 205 lb 3.2 oz (93.078 kg)  SpO2 95%     Objective:   Physical Exam  Nursing note and vitals reviewed. Constitutional: He is oriented to person, place, and time. He appears well-developed and well-nourished. No distress.  HENT:  Head: Normocephalic and atraumatic.  Eyes: Conjunctivae and EOM are normal. Pupils are equal, round, and reactive to light.  Cardiovascular: Normal rate, regular rhythm, normal heart sounds and intact distal pulses.   Pulmonary/Chest: Effort normal and breath sounds normal. No respiratory distress. He has no wheezes. He has no rales. He exhibits no tenderness.  Musculoskeletal: Normal range of motion. He exhibits no edema and no tenderness.  Neurological: He is alert and oriented to person, place, and time.  Skin: Skin is warm and dry. No rash noted. He is not diaphoretic. No erythema. No pallor.  Psychiatric: He has a normal mood and affect. His behavior is normal. Judgment and thought content normal.     Lab Results  Component Value Date   WBC 6.1 01/23/2012   HGB 15.2 01/23/2012   HCT 45.6 01/23/2012   PLT 200.0 01/23/2012   GLUCOSE 93 01/23/2012   CHOL 230* 01/23/2012   TRIG 66.0 01/23/2012   HDL 54.50 01/23/2012   LDLDIRECT 156.1 01/23/2012   LDLCALC 132* 02/06/2011   ALT 23 01/23/2012   AST 24  01/23/2012   NA 137 01/23/2012   K 4.3 01/23/2012   CL 102 01/23/2012   CREATININE 1.1 01/23/2012   BUN 22 01/23/2012   CO2 27 01/23/2012   TSH 1.59 04/06/2010        Assessment & Plan:  Jose was seen today for chest pulse on the left side of chest.  Diagnoses and associated orders for this visit:  Muscle twitching Comments: Chest /pec muscle. Watchful waiting.    Spoke with Dr. Elease Hashimoto regarding the patient, and I agree with him that due to the lack of chest pain, shortness of breath, dyspnea on exertion symptoms, as well as a normal physical exam, this is most likely chest muscle twitching as the patient initially thought. At this time only watchful waiting is indicated. The patient is amenable to this plan.  Return precautions provided.  Plan to follow up as needed, or for worsening or persistent symptoms despite treatment.  Patient Instructions  If emergency symptoms discussed during visit developed, seek medical attention immediately.  Followup as needed, or for worsening or persistent symptoms despite treatment.

## 2013-12-06 ENCOUNTER — Other Ambulatory Visit: Payer: Self-pay | Admitting: Family

## 2013-12-06 MED ORDER — IMIPRAMINE HCL 10 MG PO TABS: 10.0000 mg | ORAL_TABLET | Freq: Every day | ORAL | Status: DC

## 2013-12-07 ENCOUNTER — Telehealth: Payer: Self-pay | Admitting: Family

## 2013-12-07 MED ORDER — VALACYCLOVIR HCL 1 G PO TABS: ORAL_TABLET | ORAL | Status: DC

## 2013-12-07 NOTE — Telephone Encounter (Signed)
Sent in by [employer]

## 2013-12-07 NOTE — Telephone Encounter (Signed)
Pt needs refill generic valtrex 1000 mg call into [facility]

## 2013-12-07 NOTE — Telephone Encounter (Signed)
Done

## 2013-12-30 ENCOUNTER — Telehealth: Payer: Self-pay | Admitting: Family

## 2013-12-30 ENCOUNTER — Other Ambulatory Visit: Payer: Self-pay | Admitting: Family

## 2013-12-30 NOTE — Telephone Encounter (Signed)
Pt request refill valACYclovir (VALTREX) 1000 MG tablet [facility]

## 2013-12-31 MED ORDER — VALACYCLOVIR HCL 1 G PO TABS: ORAL_TABLET | ORAL | Status: DC

## 2014-02-09 ENCOUNTER — Telehealth: Payer: Self-pay | Admitting: Family

## 2014-02-09 NOTE — Telephone Encounter (Signed)
Patient Information:  Caller Name: Camila Li  Phone: [phone]  Patient: Jose Reyes  Gender: Male  DOB: 11/23/1951  Age: 38 Years  PCP: Carolann Littler [facility])  Office Follow Up:  Does the office need to follow up with this patient?: No  Instructions For The Office: N/A    Patient Information:  Caller Name: Jose  Phone: [phone]  Patient: Reyes, Jose R  Gender: Male  DOB: [birth year]  Age: 56 Years  PCP: Phoebe Sharps (Adults only)  Office Follow Up:  Does the office need to follow up with this patient?: No  Instructions For The Office: N/A   Symptoms  Reason For Call & Symptoms: Pt is calling about having aching in his jaws - both sides. He states it feel stiff - he has had it for a couple of months.  Reviewed Health History In EMR: Yes  Reviewed Medications In EMR: Yes  Reviewed Allergies In EMR: Yes  Reviewed Surgeries / Procedures: Yes  Date of Onset of Symptoms: 10/10/2013  Guideline(s) Used:  Face Pain  Disposition Per Guideline:   See Within 2 Weeks in Office  Reason For Disposition Reached:   Face pain is a chronic symptom (recurrent or ongoing AND present > 4 weeks)  Advice Given:  N/A  Patient Will Follow Care Advice:  YES

## 2014-02-09 NOTE — Telephone Encounter (Signed)
Noted  

## 2014-04-02 ENCOUNTER — Other Ambulatory Visit: Payer: Self-pay | Admitting: Family

## 2014-04-10 ENCOUNTER — Other Ambulatory Visit: Payer: Self-pay | Admitting: Family

## 2014-04-26 ENCOUNTER — Encounter: Payer: Self-pay | Admitting: Gastroenterology

## 2014-05-12 ENCOUNTER — Encounter: Payer: Self-pay | Admitting: Family

## 2014-05-12 ENCOUNTER — Other Ambulatory Visit: Payer: Self-pay | Admitting: Family

## 2014-05-12 ENCOUNTER — Ambulatory Visit (INDEPENDENT_AMBULATORY_CARE_PROVIDER_SITE_OTHER): Payer: Self-pay | Admitting: Family

## 2014-05-12 VITALS — BP 130/80 | Temp 98.3°F | Wt 206.4 lb

## 2014-05-12 DIAGNOSIS — F411 Generalized anxiety disorder: Secondary | ICD-10-CM

## 2014-05-12 DIAGNOSIS — F32A Depression, unspecified: Secondary | ICD-10-CM

## 2014-05-12 DIAGNOSIS — F329 Major depressive disorder, single episode, unspecified: Secondary | ICD-10-CM

## 2014-05-12 MED ORDER — SERTRALINE HCL 100 MG PO TABS: 100.0000 mg | ORAL_TABLET | Freq: Every day | ORAL | Status: DC

## 2014-05-12 NOTE — Progress Notes (Signed)
Subjective:    Patient ID: Jose Reyes, male    DOB: [birth year], 39 y.o.   MRN: [id number]  HPI 39 y.o. White male presents today for medication follow up. Patient has hx of anxiety and depression and has been taking 50mg  of sertraline for "years". Pt states that his mood is improved on the medication, but he feels his improvement has plateaued. Pt request an increase in sertraline. Denies suicidal ideation, erectile dysfunction, fever, chills and fatigue.   Past Medical History  Diagnosis Date  . Cancer   . Testicular cancer     History   Social History  . Marital Status: Single    Spouse Name: N/A  . Number of Children: N/A  . Years of Education: N/A   Occupational History  . Not on file.   Social History Main Topics  . Smoking status: Never Smoker   . Smokeless tobacco: Never Used  . Alcohol Use: Yes     Comment: occ  . Drug Use: No  . Sexual Activity: Not on file   Other Topics Concern  . Not on file   Social History Narrative    Past Surgical History  Procedure Laterality Date  . Joint replacement      No family history on file.  Allergies  Allergen Reactions  . Penicillins     REACTION: unknown    Current Outpatient Prescriptions on File Prior to Visit  Medication Sig Dispense Refill  . acyclovir cream (ZOVIRAX) 5 % Apply 1 application topically 3 (three) times daily. 5 g 5  . EPINEPHrine (EPIPEN 2-PAK) 0.3 mg/0.3 mL DEVI Inject 0.3 mLs (0.3 mg total) into the muscle once. 1 Device 2  . hydrocortisone (PROCTOSOL HC) 2.5 % rectal cream Place rectally 2 (two) times daily. 30 g 0  . ibuprofen (ADVIL,MOTRIN) 200 MG tablet Take 200 mg by mouth every 6 (six) hours as needed.      Marland Kitchen imipramine (TOFRANIL) 10 MG tablet Take 1 tablet (10 mg total) by mouth at bedtime. 30 tablet 5  . meloxicam (MOBIC) 15 MG tablet Take 15 mg by mouth daily.      Marland Kitchen omeprazole (PRILOSEC OTC) 20 MG tablet Take 20 mg by mouth daily.      . valACYclovir (VALTREX) 1000 MG tablet  TAKE 1 TABLET BY MOUTH 3 TIMES A DAY 90 tablet 0   No current facility-administered medications on file prior to visit.    BP 130/80 mmHg  Temp(Src) 98.3 F (36.8 C) (Oral)  Wt 206 lb 6.4 oz (93.622 kg)chart  Review of Systems  Constitutional: Negative.  Negative for fever, chills, activity change and fatigue.  Respiratory: Negative.  Negative for chest tightness and shortness of breath.   Cardiovascular: Negative.  Negative for chest pain.  Gastrointestinal: Negative.   Neurological: Negative.   Psychiatric/Behavioral: Negative.    .jj    Objective:   Physical Exam  Constitutional: He appears well-developed and well-nourished. He is active.  Cardiovascular: Normal rate, regular rhythm, normal heart sounds and normal pulses.   Pulmonary/Chest: Effort normal and breath sounds normal.  Abdominal: Soft. Normal appearance and bowel sounds are normal.  Neurological: He is alert.  Psychiatric: He has a normal mood and affect. His speech is normal and behavior is normal. Thought content normal.          Assessment & Plan:  Jose was seen today for follow-up.  Diagnoses and all orders for this visit:  Depression  Generalized anxiety disorder  Other orders -  Discontinue: sertraline (ZOLOFT) 100 MG tablet; Take 1 tablet (100 mg total) by mouth daily. -     sertraline (ZOLOFT) 100 MG tablet; Take 1 tablet (100 mg total) by mouth daily.

## 2014-05-12 NOTE — Progress Notes (Signed)
Pre visit review using our clinic review tool, if applicable. No additional management support is needed unless otherwise documented below in the visit note. 

## 2014-05-12 NOTE — Patient Instructions (Signed)

## 2014-06-08 ENCOUNTER — Encounter: Payer: Self-pay | Admitting: Internal Medicine

## 2014-06-08 ENCOUNTER — Ambulatory Visit (INDEPENDENT_AMBULATORY_CARE_PROVIDER_SITE_OTHER): Payer: BLUE CROSS/BLUE SHIELD | Admitting: Internal Medicine

## 2014-06-08 VITALS — BP 146/80 | HR 76 | Temp 97.8°F | Resp 20 | Ht 76.0 in | Wt 208.0 lb

## 2014-06-08 DIAGNOSIS — J340 Abscess, furuncle and carbuncle of nose: Secondary | ICD-10-CM | POA: Diagnosis not present

## 2014-06-08 MED ORDER — CEPHALEXIN 500 MG PO CAPS: 500.0000 mg | ORAL_CAPSULE | Freq: Three times a day (TID) | ORAL | Status: DC

## 2014-06-08 NOTE — Progress Notes (Signed)
Pre visit review using our clinic review tool, if applicable. No additional management support is needed unless otherwise documented below in the visit note. 

## 2014-06-08 NOTE — Patient Instructions (Signed)
Call or return to clinic prn if these symptoms worsen or fail to improve as anticipated.

## 2014-06-08 NOTE — Progress Notes (Signed)
Subjective:    Patient ID: Jose Reyes, male    DOB: 12/05/1975, 39 y.o.   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  He states that he has intermittent right-sided epistaxis and irritation from the right nares.  Over the past 4-5 days he has had increasing right nasal discomfort.  There is been no external redness.  No fever or other constitutional complaints  He has carried a diagnosis of penicillin allergy since treatment for testicular cancer at age 33.  He does not recall the details, but also does not recall any severe allergic reaction  Past Medical History  Diagnosis Date  . Cancer   . Testicular cancer     History   Social History  . Marital Status: Single    Spouse Name: N/A  . Number of Children: N/A  . Years of Education: N/A   Occupational History  . Not on file.   Social History Main Topics  . Smoking status: Never Smoker   . Smokeless tobacco: Never Used  . Alcohol Use: Yes     Comment: occ  . Drug Use: No  . Sexual Activity: Not on file   Other Topics Concern  . Not on file   Social History Narrative    Past Surgical History  Procedure Laterality Date  . Joint replacement      No family history on file.  Allergies  Allergen Reactions  . Penicillins     REACTION: unknown    Current Outpatient Prescriptions on File Prior to Visit  Medication Sig Dispense Refill  . acyclovir cream (ZOVIRAX) 5 % Apply 1 application topically 3 (three) times daily. 5 g 5  . EPINEPHrine (EPIPEN 2-PAK) 0.3 mg/0.3 mL DEVI Inject 0.3 mLs (0.3 mg total) into the muscle once. 1 Device 2  . hydrocortisone (PROCTOSOL HC) 2.5 % rectal cream Place rectally 2 (two) times daily. 30 g 0  . ibuprofen (ADVIL,MOTRIN) 200 MG tablet Take 200 mg by mouth every 6 (six) hours as needed.      Marland Kitchen imipramine (TOFRANIL) 10 MG tablet Take 1 tablet (10 mg total) by mouth at bedtime. 30 tablet 5  . meloxicam (MOBIC) 15  MG tablet Take 15 mg by mouth daily.      Marland Kitchen omeprazole (PRILOSEC OTC) 20 MG tablet Take 20 mg by mouth daily.      . sertraline (ZOLOFT) 100 MG tablet Take 1 tablet (100 mg total) by mouth daily. 90 tablet 3  . valACYclovir (VALTREX) 1000 MG tablet TAKE 1 TABLET BY MOUTH 3 TIMES A DAY 90 tablet 0   No current facility-administered medications on file prior to visit.    BP 146/80 mmHg  Pulse 76  Temp(Src) 97.8 F (36.6 C) (Oral)  Resp 20  Ht 6\' 4"  (1.93 m)  Wt 208 lb (94.348 kg)  BMI 25.33 kg/m2  SpO2 98%      Review of Systems  Constitutional: Negative for fever, chills, appetite change and fatigue.  HENT: Positive for nosebleeds and postnasal drip. Negative for congestion, dental problem, ear pain, hearing loss, sore throat, tinnitus, trouble swallowing and voice change.   Eyes: Negative for pain, discharge and visual disturbance.  Respiratory: Negative for cough, chest tightness, wheezing and stridor.   Cardiovascular: Negative for chest pain, palpitations and leg swelling.  Gastrointestinal: Negative for nausea, vomiting, abdominal pain, diarrhea, constipation, blood in stool and abdominal distention.  Genitourinary: Negative for urgency, hematuria,  flank pain, discharge, difficulty urinating and genital sores.  Musculoskeletal: Negative for myalgias, back pain, joint swelling, arthralgias, gait problem and neck stiffness.  Skin: Negative for rash.  Neurological: Negative for dizziness, syncope, speech difficulty, weakness, numbness and headaches.  Hematological: Negative for adenopathy. Does not bruise/bleed easily.  Psychiatric/Behavioral: Negative for behavioral problems and dysphoric mood. The patient is not nervous/anxious.        Objective:   Physical Exam  Constitutional: He appears well-developed. No distress.  HENT:  Right nares was examined and there was no obvious pathology.  External nose revealed no erythema but was tender to palpation            Assessment & Plan:   Right nasal pain.  Nasal mucosa appears to be at least intermittently irritated with  bleeding.  Probably has developed an early area of folliculitis or cellulitis.  Will treat with nasal irrigation and warm compresses.  If he fails to improve, we'll treat with Keflex

## 2014-06-11 ENCOUNTER — Ambulatory Visit: Payer: Self-pay | Admitting: Gastroenterology

## 2014-06-16 ENCOUNTER — Ambulatory Visit (INDEPENDENT_AMBULATORY_CARE_PROVIDER_SITE_OTHER): Payer: BLUE CROSS/BLUE SHIELD | Admitting: Gastroenterology

## 2014-06-16 VITALS — BP 138/80 | HR 68 | Ht 76.0 in | Wt 205.0 lb

## 2014-06-16 DIAGNOSIS — K219 Gastro-esophageal reflux disease without esophagitis: Secondary | ICD-10-CM | POA: Diagnosis not present

## 2014-06-16 DIAGNOSIS — R6889 Other general symptoms and signs: Secondary | ICD-10-CM

## 2014-06-16 DIAGNOSIS — IMO0001 Reserved for inherently not codable concepts without codable children: Secondary | ICD-10-CM

## 2014-06-16 DIAGNOSIS — R198 Other specified symptoms and signs involving the digestive system and abdomen: Secondary | ICD-10-CM | POA: Diagnosis not present

## 2014-06-16 DIAGNOSIS — R143 Flatulence: Secondary | ICD-10-CM | POA: Diagnosis not present

## 2014-06-16 DIAGNOSIS — R0989 Other specified symptoms and signs involving the circulatory and respiratory systems: Secondary | ICD-10-CM

## 2014-06-16 MED ORDER — PANTOPRAZOLE SODIUM 40 MG PO TBEC: 40.0000 mg | DELAYED_RELEASE_TABLET | Freq: Two times a day (BID) | ORAL | Status: DC

## 2014-06-16 NOTE — Progress Notes (Signed)
History of Present Illness: This is a 39 year old male referred by Izora Gala, MD for the evaluation of chronic throat clearing and GERD. EGD in 2010 was normal. He relates several years of frequent throat clearing and coughing associated with small amounts of phlegm. He states his heartburn symptoms are well-controlled on Prilosec 20 mg daily. Was evaluated by Dr. Constance Holster and referred for further evaluation. In addition he complains of significant intestinal gas with occasional belching and frequent flatus. This problem has been present for years.  Allergies  Allergen Reactions  . Penicillins     REACTION: unknown   Outpatient Prescriptions Prior to Visit  Medication Sig Dispense Refill  . acyclovir cream (ZOVIRAX) 5 % Apply 1 application topically 3 (three) times daily. 5 g 5  . cephALEXin (KEFLEX) 500 MG capsule Take 1 capsule (500 mg total) by mouth 3 (three) times daily. 21 capsule 0  . EPINEPHrine (EPIPEN 2-PAK) 0.3 mg/0.3 mL DEVI Inject 0.3 mLs (0.3 mg total) into the muscle once. 1 Device 2  . hydrocortisone (PROCTOSOL HC) 2.5 % rectal cream Place rectally 2 (two) times daily. 30 g 0  . ibuprofen (ADVIL,MOTRIN) 200 MG tablet Take 200 mg by mouth every 6 (six) hours as needed.      Marland Kitchen imipramine (TOFRANIL) 10 MG tablet Take 1 tablet (10 mg total) by mouth at bedtime. 30 tablet 5  . meloxicam (MOBIC) 15 MG tablet Take 15 mg by mouth daily.      Marland Kitchen omeprazole (PRILOSEC OTC) 20 MG tablet Take 20 mg by mouth daily.      . sertraline (ZOLOFT) 100 MG tablet Take 1 tablet (100 mg total) by mouth daily. 90 tablet 3  . valACYclovir (VALTREX) 1000 MG tablet TAKE 1 TABLET BY MOUTH 3 TIMES A DAY 90 tablet 0   No facility-administered medications prior to visit.   Past Medical History  Diagnosis Date  . Cancer   . Testicular cancer   . Reflux    Past Surgical History  Procedure Laterality Date  . Joint replacement    . Tooth extraction     History   Social History  . Marital Status:  Single    Spouse Name: N/A  . Number of Children: N/A  . Years of Education: N/A   Social History Main Topics  . Smoking status: Never Smoker   . Smokeless tobacco: Never Used  . Alcohol Use: Yes     Comment: occ  . Drug Use: No  . Sexual Activity: Not on file   Other Topics Concern  . None   Social History Narrative   Family History  Problem Relation Age of Onset  . Stomach cancer Father   . Esophageal cancer Father      Review of Systems: Pertinent positive and negative review of systems were noted in the above HPI section. All other review of systems were otherwise negative.   Physical Exam: General: Well developed, well nourished, no acute distress Head: Normocephalic and atraumatic Eyes:  sclerae anicteric, EOMI Ears: Normal auditory acuity Mouth: No deformity or lesions Neck: Supple, no masses or thyromegaly Lungs: Clear throughout to auscultation Heart: Regular rate and rhythm; no murmurs, rubs or bruits Abdomen: Soft, non tender and non distended. No masses, hepatosplenomegaly or hernias noted. Normal Bowel sounds Musculoskeletal: Symmetrical with no gross deformities  Skin: No lesions on visible extremities Pulses:  Normal pulses noted Extremities: No clubbing, cyanosis, edema or deformities noted Neurological: Alert oriented x 4, grossly nonfocal Cervical  Nodes:  No significant cervical adenopathy Inguinal Nodes: No significant inguinal adenopathy Psychological:  Alert and cooperative. Normal mood and affect  Assessment and Recommendations:  1. Chronic GERD. 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 Throat clearing may have other causes including allergies and other ENT disorders. Intensify all antireflux measures including 4 " bedblocks. Increase omeprazole to 40 mg twice daily. If symptoms do not come under good control win 2-3 months will consider upper endoscopy for further evaluation. Return office visit 2 to 3 months.  2. Intestinal  gas with flatus and belching. Trial of 2 or 3 different probiotics for 2-3 weeks each. Begin a low gas diet. Gas-X 4 times a day when necessary.  cc: Izora Gala, MD [address] [place], [place] [address]

## 2014-06-16 NOTE — Patient Instructions (Signed)
Discontinue your omeprazole.  We have sent the following medications to your pharmacy for you to pick up at your convenience:pantoprazole 40 mg twice a day.   Patient advised to avoid spicy, acidic, citrus, chocolate, mints, fruit and fruit juices.  Limit the intake of caffeine, alcohol and Soda.  Don't exercise too soon after eating.  Don't lie down within 3-4 hours of eating.  Elevate the head of your bed.  You have been given a Low gas diet.   Use over the counter Gas-X four times a day.   Your follow up appointment with Dr. Fuller Plan is on 08/25/14 at 9:00am.   Thank you for choosing me and [facility].  Pricilla Riffle. Dagoberto Ligas., MD., Marval Regal  cc: Izora Gala, MD

## 2014-06-21 ENCOUNTER — Ambulatory Visit: Payer: Self-pay | Admitting: Gastroenterology

## 2014-08-25 ENCOUNTER — Ambulatory Visit: Payer: BLUE CROSS/BLUE SHIELD | Admitting: Gastroenterology

## 2014-08-27 ENCOUNTER — Ambulatory Visit (INDEPENDENT_AMBULATORY_CARE_PROVIDER_SITE_OTHER): Payer: BLUE CROSS/BLUE SHIELD | Admitting: Family

## 2014-08-27 ENCOUNTER — Encounter: Payer: Self-pay | Admitting: Family

## 2014-08-27 VITALS — BP 146/90 | HR 83 | Temp 98.1°F | Wt 206.0 lb

## 2014-08-27 DIAGNOSIS — L0889 Other specified local infections of the skin and subcutaneous tissue: Secondary | ICD-10-CM | POA: Diagnosis not present

## 2014-08-27 DIAGNOSIS — L089 Local infection of the skin and subcutaneous tissue, unspecified: Secondary | ICD-10-CM

## 2014-08-27 NOTE — Progress Notes (Signed)
Subjective:    Patient ID: Jose Reyes, male    DOB: 14-Nov-1975, 39 y.o.   MRN: [id number]  HPI 39 year old white male, is in today with c/o right groin bump x 1 day. Minimal pain. No drainage or discharge. Has a history of testicular cancer. [place]    Review of Systems  Constitutional: Negative.   Respiratory: Negative.   Cardiovascular: Negative.   Musculoskeletal: Negative.   Skin:       Right groin lump.   Allergic/Immunologic: Negative.   Psychiatric/Behavioral: Negative.    Past Medical History  Diagnosis Date  . Cancer   . Testicular cancer   . Reflux     History   Social History  . Marital Status: Single    Spouse Name: N/A  . Number of Children: N/A  . Years of Education: N/A   Occupational History  . Not on file.   Social History Main Topics  . Smoking status: Never Smoker   . Smokeless tobacco: Never Used  . Alcohol Use: Yes     Comment: occ  . Drug Use: No  . Sexual Activity: Not on file   Other Topics Concern  . Not on file   Social History Narrative    Past Surgical History  Procedure Laterality Date  . Joint replacement    . Tooth extraction      Family History  Problem Relation Age of Onset  . Stomach cancer Father   . Esophageal cancer Father     Allergies  Allergen Reactions  . Penicillins     REACTION: unknown    Current Outpatient Prescriptions on File Prior to Visit  Medication Sig Dispense Refill  . EPINEPHrine (EPIPEN 2-PAK) 0.3 mg/0.3 mL DEVI Inject 0.3 mLs (0.3 mg total) into the muscle once. 1 Device 2  . ibuprofen (ADVIL,MOTRIN) 200 MG tablet Take 200 mg by mouth every 6 (six) hours as needed.      Marland Kitchen imipramine (TOFRANIL) 10 MG tablet Take 1 tablet (10 mg total) by mouth at bedtime. 30 tablet 5  . meloxicam (MOBIC) 15 MG tablet Take 15 mg by mouth daily.      . pantoprazole (PROTONIX) 40 MG tablet Take 1 tablet (40 mg total) by mouth 2 (two) times daily. 60 tablet  11  . sertraline (ZOLOFT) 100 MG tablet Take 1 tablet (100 mg total) by mouth daily. 90 tablet 3  . valACYclovir (VALTREX) 1000 MG tablet TAKE 1 TABLET BY MOUTH 3 TIMES A DAY 90 tablet 0  . acyclovir cream (ZOVIRAX) 5 % Apply 1 application topically 3 (three) times daily. (Patient not taking: Reported on 08/27/2014) 5 g 5  . hydrocortisone (PROCTOSOL HC) 2.5 % rectal cream Place rectally 2 (two) times daily. (Patient not taking: Reported on 08/27/2014) 30 g 0   No current facility-administered medications on file prior to visit.    BP 146/90 mmHg  Pulse 83  Temp(Src) 98.1 F (36.7 C) (Oral)  Wt 206 lb (93.441 kg)chart    Objective:   Physical Exam  Constitutional: He is oriented to person, place, and time. He appears well-developed and well-nourished.  Neck: Normal range of motion. Neck supple.  Cardiovascular: Normal rate, regular rhythm and normal heart sounds.   Pulmonary/Chest: Effort normal and breath sounds normal.  Musculoskeletal: Normal range of motion.  Neurological: He is alert and oriented to person, place, and time.  Skin: Skin is warm and dry.  Psychiatric: He has a normal mood and affect.          Assessment & Plan:  Diagnoses and all orders for this visit:  Pustular inflammation of skin  Call the office with any questions or concerns. Return on Wednesday of no better for I&D. Because he will be in [place], postponed I&D.

## 2014-08-27 NOTE — Patient Instructions (Signed)

## 2014-08-27 NOTE — Progress Notes (Signed)
Pre visit review using our clinic review tool, if applicable. No additional management support is needed unless otherwise documented below in the visit note. 

## 2014-09-01 ENCOUNTER — Encounter: Payer: Self-pay | Admitting: Family

## 2014-09-01 ENCOUNTER — Ambulatory Visit (INDEPENDENT_AMBULATORY_CARE_PROVIDER_SITE_OTHER): Payer: BLUE CROSS/BLUE SHIELD | Admitting: Family

## 2014-09-01 VITALS — BP 120/80 | Temp 98.1°F | Wt 208.0 lb

## 2014-09-01 DIAGNOSIS — L723 Sebaceous cyst: Secondary | ICD-10-CM | POA: Diagnosis not present

## 2014-09-01 NOTE — Patient Instructions (Signed)
Incision Care °An incision is when a surgeon cuts into your body tissues. After surgery, the incision needs to be cared for properly to prevent infection.  °HOME CARE INSTRUCTIONS  °· Take all medicine as directed by your caregiver. Only take over-the-counter or prescription medicines for pain, discomfort, or fever as directed by your caregiver. °· Do not remove your bandage (dressing) or get your incision wet until your surgeon gives you permission. In the event that your dressing becomes wet, dirty, or starts to smell, change the dressing and call your surgeon for instructions as soon as possible. °· Take showers. Do not take tub baths, swim, or do anything that may soak the wound until it is healed. °· Resume your normal diet and activities as directed or allowed. °· Avoid lifting any weight until you are instructed otherwise. °· Use anti-itch antihistamine medicine as directed by your caregiver. The wound may itch when it is healing. Do not pick or scratch at the wound. °· Follow up with your caregiver for stitch (suture) or staple removal as directed. °· Drink enough fluids to keep your urine clear or pale yellow. °SEEK MEDICAL CARE IF:  °· You have redness, swelling, or increasing pain in the wound that is not controlled with medicine. °· You have drainage, blood, or pus coming from the wound that lasts longer than 1 day. °· You develop muscle aches, chills, or a general ill feeling. °· You notice a bad smell coming from the wound or dressing. °· Your wound edges separate after the sutures, staples, or skin adhesive strips have been removed. °· You develop persistent nausea or vomiting. °SEEK IMMEDIATE MEDICAL CARE IF:  °· You have a fever. °· You develop a rash. °· You develop dizzy episodes or faint while standing. °· You have difficulty breathing. °· You develop any reaction or side effects to medicine given. °MAKE SURE YOU:  °· Understand these instructions. °· Will watch your condition. °· Will get help  right away if you are not doing well or get worse. °Document Released: 09/15/2004 Document Revised: 05/21/2011 Document Reviewed: 04/22/2013 °ExitCare® Patient Information ©2015 [employer]. This information is not intended to replace advice given to you by your health care provider. Make sure you discuss any questions you have with your health care provider. ° ° °

## 2014-09-01 NOTE — Progress Notes (Signed)
Pre visit review using our clinic review tool, if applicable. No additional management support is needed unless otherwise documented below in the visit note. 

## 2014-09-01 NOTE — Progress Notes (Signed)
   Subjective:    Patient ID: Jose Reyes, male    DOB: Dec 23, 1975, 39 y.o.   MRN: [id number]  HPI    Review of Systems  Constitutional: Negative.   Respiratory: Negative.   Cardiovascular: Negative.        Objective:   Physical Exam  Constitutional: He is oriented to person, place, and time. He appears well-developed and well-nourished.  Pulmonary/Chest: Effort normal and breath sounds normal.  Neurological: He is alert and oriented to person, place, and time.  Skin: Skin is warm and dry.     Psychiatric: He has a normal mood and affect.   Informed consent was given by the patient for a incision and drainage. The site was prepped with Betadine and using a 1 blade a 1 cm incision made into the skin. Hemostasis was achieved with a compression. Wound care was discussed with the patient. The patient was informed that it would be one to 2 weeks before the pathology will be interpreted.       Assessment & Plan:  Diagnoses and all orders for this visit:  Sebaceous cyst   Call the office with any questions or concerns. Recheck as scheduled.

## 2014-10-20 ENCOUNTER — Other Ambulatory Visit: Payer: Self-pay | Admitting: Family

## 2014-12-15 ENCOUNTER — Encounter: Payer: Self-pay | Admitting: Adult Health

## 2014-12-15 ENCOUNTER — Telehealth: Payer: Self-pay | Admitting: Family

## 2014-12-15 ENCOUNTER — Ambulatory Visit (INDEPENDENT_AMBULATORY_CARE_PROVIDER_SITE_OTHER): Payer: BLUE CROSS/BLUE SHIELD | Admitting: Adult Health

## 2014-12-15 VITALS — BP 134/80 | HR 99 | Temp 99.4°F | Ht 76.0 in | Wt 204.5 lb

## 2014-12-15 DIAGNOSIS — R6889 Other general symptoms and signs: Secondary | ICD-10-CM

## 2014-12-15 LAB — CBC WITH DIFFERENTIAL/PLATELET
BASOS PCT: 0.3 % (ref 0.0–3.0)
Basophils Absolute: 0 10*3/uL (ref 0.0–0.1)
EOS ABS: 0.2 10*3/uL (ref 0.0–0.7)
Eosinophils Relative: 1.5 % (ref 0.0–5.0)
HCT: 43.8 % (ref 39.0–52.0)
HEMOGLOBIN: 14.8 g/dL (ref 13.0–17.0)
LYMPHS ABS: 1.2 10*3/uL (ref 0.7–4.0)
Lymphocytes Relative: 10.8 % — ABNORMAL LOW (ref 12.0–46.0)
MCHC: 33.7 g/dL (ref 30.0–36.0)
MCV: 92.9 fl (ref 78.0–100.0)
MONO ABS: 0.9 10*3/uL (ref 0.1–1.0)
Monocytes Relative: 7.6 % (ref 3.0–12.0)
NEUTROS PCT: 79.8 % — AB (ref 43.0–77.0)
Neutro Abs: 9.1 10*3/uL — ABNORMAL HIGH (ref 1.4–7.7)
Platelets: 213 10*3/uL (ref 150.0–400.0)
RBC: 4.72 Mil/uL (ref 4.22–5.81)
RDW: 13.1 % (ref 11.5–15.5)
WBC: 11.4 10*3/uL — AB (ref 4.0–10.5)

## 2014-12-15 LAB — POCT INFLUENZA A/B
INFLUENZA A, POC: NEGATIVE
Influenza B, POC: NEGATIVE

## 2014-12-15 LAB — POCT RAPID STREP A (OFFICE): RAPID STREP A SCREEN: NEGATIVE

## 2014-12-15 LAB — POCT MONO (EPSTEIN BARR VIRUS): Mono, POC: NEGATIVE

## 2014-12-15 MED ORDER — MAGIC MOUTHWASH W/LIDOCAINE: 5.0000 mL | Freq: Three times a day (TID) | ORAL | Status: DC | PRN

## 2014-12-15 MED ORDER — HYDROCODONE-HOMATROPINE 5-1.5 MG/5ML PO SYRP: 5.0000 mL | ORAL_SOLUTION | Freq: Three times a day (TID) | ORAL | Status: DC | PRN

## 2014-12-15 NOTE — Telephone Encounter (Signed)
Please let him know I am full and advise of other options - Cory and Dr. Yong Channel. Thanks.

## 2014-12-15 NOTE — Progress Notes (Signed)
Pre visit review using our clinic review tool, if applicable. No additional management support is needed unless otherwise documented below in the visit note. 

## 2014-12-15 NOTE — Progress Notes (Signed)
Subjective:    Patient ID: Jose Reyes, male    DOB: Mar 06, 1976, 39 y.o.   MRN: [id number]  HPI  39 year old healthy male who presents to the office today with Sore throat, cough, congestion, fatigue, body aches. On Monday he started to have a sore throat and cough and it has progressed to the fatigue, body aches and low grade fever. He continues to have a sore throat (pain with swallowing) and occasional productive cough  Has been using Mucinex, Advil cold and flu and Theraflu. This gives him minimal relief.   Denies any sick contacts or recent travel.   Review of Systems  Constitutional: Positive for fever, chills, activity change and fatigue. Negative for appetite change.  HENT: Positive for congestion, sinus pressure and sore throat. Negative for ear discharge, ear pain, postnasal drip and rhinorrhea.   Eyes: Negative.   Respiratory: Negative.   Cardiovascular: Negative.   Gastrointestinal: Negative for nausea, diarrhea, constipation and rectal pain.  Musculoskeletal: Positive for myalgias. Negative for back pain, neck pain and neck stiffness.  Skin: Negative.   Neurological: Positive for dizziness and weakness. Negative for numbness.  All other systems reviewed and are negative.  Past Medical History  Diagnosis Date  . Cancer ([place])   . Testicular cancer ([place])   . Reflux     Social History   Social History  . Marital Status: Single    Spouse Name: N/A  . Number of Children: N/A  . Years of Education: N/A   Occupational History  . Not on file.   Social History Main Topics  . Smoking status: Never Smoker   . Smokeless tobacco: Never Used  . Alcohol Use: Yes     Comment: occ  . Drug Use: No  . Sexual Activity: Not on file   Other Topics Concern  . Not on file   Social History Narrative    Past Surgical History  Procedure Laterality Date  . Joint replacement    . Tooth extraction      Family History  Problem Relation Age of Onset  . Stomach cancer  Father   . Esophageal cancer Father     Allergies  Allergen Reactions  . Penicillins     REACTION: unknown    Current Outpatient Prescriptions on File Prior to Visit  Medication Sig Dispense Refill  . ibuprofen (ADVIL,MOTRIN) 200 MG tablet Take 200 mg by mouth every 6 (six) hours as needed.      . pantoprazole (PROTONIX) 40 MG tablet Take 1 tablet (40 mg total) by mouth 2 (two) times daily. 60 tablet 11  . sertraline (ZOLOFT) 100 MG tablet Take 1 tablet (100 mg total) by mouth daily. 90 tablet 3  . EPINEPHrine (EPIPEN 2-PAK) 0.3 mg/0.3 mL DEVI Inject 0.3 mLs (0.3 mg total) into the muscle once. (Patient not taking: Reported on 12/15/2014) 1 Device 2  . imipramine (TOFRANIL) 10 MG tablet Take 1 tablet (10 mg total) by mouth at bedtime. (Patient not taking: Reported on 12/15/2014) 30 tablet 5  . valACYclovir (VALTREX) 1000 MG tablet TAKE 1 TABLET BY MOUTH 3 TIMES A DAY (Patient not taking: Reported on 12/15/2014) 90 tablet 0   No current facility-administered medications on file prior to visit.    BP 134/80 mmHg  Pulse 99  Temp(Src) 99.4 F (37.4 C) (Oral)  Ht 6\' 4"  (1.93 m)  Wt 204 lb 8 oz (92.761 kg)  BMI 24.90 kg/m2  SpO2 97%       Objective:  Physical Exam  Constitutional: He is oriented to person, place, and time. He appears well-developed and well-nourished. No distress.  Appears tired and worn down  HENT:  Head: Normocephalic and atraumatic.  Right Ear: External ear normal.  Left Ear: External ear normal.  Nose: Nose normal.  Mouth/Throat: Oropharynx is clear and moist. No oropharyngeal exudate.  Eyes: Conjunctivae and EOM are normal. Pupils are equal, round, and reactive to light. Right eye exhibits no discharge. Left eye exhibits no discharge. No scleral icterus.  Neck: Normal range of motion. Neck supple. Thyromegaly present.  Cardiovascular: Normal rate, regular rhythm, normal heart sounds and intact distal pulses.  Exam reveals no gallop and no friction rub.     No murmur heard. Pulmonary/Chest: Effort normal and breath sounds normal. No respiratory distress. He has no wheezes. He has no rales. He exhibits no tenderness.  Abdominal: Soft. Bowel sounds are normal. He exhibits no distension and no mass. There is no tenderness. There is no rebound and no guarding.  Lymphadenopathy:    He has no cervical adenopathy.  Neurological: He is alert and oriented to person, place, and time.  Skin: Skin is warm and dry. No rash noted. He is not diaphoretic. No erythema. No pallor.  Psychiatric: He has a normal mood and affect. His behavior is normal. Judgment and thought content normal.  Nursing note and vitals reviewed.      Assessment & Plan:  1. Flu-like symptoms - Likely viral. Unlikley PNA but if not feeling better by Friday consider chest xray.  - Stay well hydrated and rest.  - Tylenol for symptom relief -- HYDROcodone-homatropine (HYCODAN) 5-1.5 MG/5ML syrup; Take 5 mLs by mouth every 8 (eight) hours as needed for cough.  Dispense: 120 mL; Refill: 0 - magic mouthwash w/lidocaine SOLN; Take 5 mLs by mouth 3 (three) times daily as needed for mouth pain.  Dispense: 120 mL; Refill: 0 - POC Mono (Epstein Barr Virus)- Negative - CBC with Differential/Platelet - POC Rapid Strep A- Negative - POC Influenza A/B- Negative - Follow up if not feeling any better in the next 2-3 days or sooner if needed

## 2014-12-15 NOTE — Patient Instructions (Addendum)
It was great meeting you today  Your flu, strep, and mono was negative.   Your signs and symptoms go along with a viral illness.   Continue to rest and stay hydrated. Take tylenol for muscle aches and fever. You can switch up between ibuprofen as well.   Use the cough syrup at night   Use the magic mouth wash three times a day as needed.

## 2014-12-15 NOTE — Telephone Encounter (Signed)
Pt would like to transfer from [facility] to dr kim. Can I sch?

## 2014-12-15 NOTE — Telephone Encounter (Signed)
Dr Maudie Mercury pt was advise of dr hunter and cory. Pt prefers male

## 2014-12-16 ENCOUNTER — Telehealth: Payer: Self-pay | Admitting: Adult Health

## 2014-12-16 NOTE — Telephone Encounter (Signed)
Left VM to see if he is feeling better this morning. Asked to call back and update on progress.

## 2014-12-16 NOTE — Telephone Encounter (Signed)
Received call from patient, he endorses feeling better, continues to have productive cough. He will call back tomorrow and let me know how he feels. At that time we will consider chest xray.

## 2014-12-16 NOTE — Telephone Encounter (Signed)
He already saw Tommi Rumps and has seen male providers on several occasions here and used to see Dr. Leanne Chang. I am sorry, but I am full.

## 2014-12-17 ENCOUNTER — Other Ambulatory Visit: Payer: Self-pay | Admitting: Adult Health

## 2014-12-17 ENCOUNTER — Other Ambulatory Visit: Payer: Self-pay | Admitting: Family

## 2014-12-17 ENCOUNTER — Telehealth: Payer: Self-pay | Admitting: Adult Health

## 2014-12-17 MED ORDER — DOXYCYCLINE HYCLATE 100 MG PO CAPS: 100.0000 mg | ORAL_CAPSULE | Freq: Two times a day (BID) | ORAL | Status: DC

## 2014-12-17 NOTE — Telephone Encounter (Signed)
Spoke to patient on the phone. He is feeling better but continues to have sinus drainage and productive cough. He is not having any fevers, nausea or vomiting. He has been taking prescribed medications for cough (Hycodon) without relief.   CBC with Diff shows increased neutrophils and decreased lymphocytes.   Will call in broad spectrum abx doxycycline 100mg  BID x 7 days.   Can try OTC Delsym or Mucinex for the cough.   He will call back on Monday and let me know how he is feeling. Consider chest xray

## 2014-12-20 NOTE — Telephone Encounter (Signed)
Ok to refill 

## 2014-12-22 MED ORDER — HYDROCODONE-HOMATROPINE 5-1.5 MG/5ML PO SYRP
5.0000 mL | ORAL_SOLUTION | Freq: Three times a day (TID) | ORAL | Status: DC | PRN
Start: 2014-12-22 — End: 2015-01-20

## 2014-12-22 NOTE — Telephone Encounter (Signed)
Left a message for pt that prescription is ready for pick up.

## 2014-12-22 NOTE — Telephone Encounter (Signed)
Ok to refill for one additional time

## 2014-12-22 NOTE — Addendum Note (Signed)
Addended by: Colleen Can on: 12/22/2014 02:01 PM   Modules accepted: Orders

## 2014-12-22 NOTE — Telephone Encounter (Signed)
Pt would like some more cough med. Hydrocodone cough med. Pt still has a cough

## 2014-12-27 ENCOUNTER — Other Ambulatory Visit: Payer: Self-pay | Admitting: Family

## 2015-01-20 ENCOUNTER — Encounter: Payer: Self-pay | Admitting: Gastroenterology

## 2015-01-20 ENCOUNTER — Ambulatory Visit (INDEPENDENT_AMBULATORY_CARE_PROVIDER_SITE_OTHER): Payer: BLUE CROSS/BLUE SHIELD | Admitting: Gastroenterology

## 2015-01-20 VITALS — BP 160/90 | HR 76 | Ht 75.5 in | Wt 204.5 lb

## 2015-01-20 DIAGNOSIS — R6889 Other general symptoms and signs: Secondary | ICD-10-CM

## 2015-01-20 DIAGNOSIS — K219 Gastro-esophageal reflux disease without esophagitis: Secondary | ICD-10-CM | POA: Diagnosis not present

## 2015-01-20 DIAGNOSIS — R0989 Other specified symptoms and signs involving the circulatory and respiratory systems: Secondary | ICD-10-CM

## 2015-01-20 DIAGNOSIS — R198 Other specified symptoms and signs involving the digestive system and abdomen: Secondary | ICD-10-CM | POA: Diagnosis not present

## 2015-01-20 DIAGNOSIS — J3489 Other specified disorders of nose and nasal sinuses: Secondary | ICD-10-CM

## 2015-01-20 NOTE — Progress Notes (Signed)
    History of Present Illness: This is a 39 year old male who relates ongoing problems with throat clearing and sinus drainage. He coughs frequently. He notes significant sinus drainage at night. Protonix and antireflux measures have controlled his heartburn but the above symptoms are uncontrolled.  Current Medications, Allergies, Past Medical History, Past Surgical History, Family History and Social History were reviewed in [employer] record.  Physical Exam: General: Well developed, well nourished, no acute distress Head: Normocephalic and atraumatic Eyes:  sclerae anicteric, EOMI Ears: Normal auditory acuity Mouth: No deformity or lesions Lungs: Clear throughout to auscultation Heart: Regular rate and rhythm; no murmurs, rubs or bruits Abdomen: Soft, non tender and non distended. No masses, hepatosplenomegaly or hernias noted. Normal Bowel sounds Musculoskeletal: Symmetrical with no gross deformities  Pulses:  Normal pulses noted Extremities: No clubbing, cyanosis, edema or deformities noted Neurological: Alert oriented x 4, grossly nonfocal Psychological:  Alert and cooperative. Normal mood and affect  Assessment and Recommendations:  1. GERD. His throat clearing and sinus drainage appear to be separate problems from GERD. Continue pantoprazole 40 mg twice daily and standard antireflux measures.  2. Chronic throat clearing and sinus drainage. This does not appear to be GERD related. Rule out sinus disorders, allergies and other non GI disorders. Trail of Claritin once daily for 1 week. Follow up with ENT and PCP for further mgmt.

## 2015-01-20 NOTE — Patient Instructions (Signed)
Follow up with Dr. Constance Holster.   Continue on Protonix daily.   Take Claritin daily for a week to see if that helps your symptoms.   Thank you for choosing me and [facility].  Pricilla Riffle. Dagoberto Ligas., MD., Marval Regal

## 2015-02-16 ENCOUNTER — Other Ambulatory Visit: Payer: Self-pay

## 2015-02-16 ENCOUNTER — Other Ambulatory Visit: Payer: Self-pay | Admitting: Family

## 2015-02-16 MED ORDER — PANTOPRAZOLE SODIUM 40 MG PO TBEC: 40.0000 mg | DELAYED_RELEASE_TABLET | Freq: Two times a day (BID) | ORAL | Status: DC

## 2015-02-16 MED ORDER — SERTRALINE HCL 100 MG PO TABS: 100.0000 mg | ORAL_TABLET | Freq: Every day | ORAL | Status: DC

## 2015-02-17 ENCOUNTER — Other Ambulatory Visit: Payer: Self-pay

## 2015-02-17 MED ORDER — PANTOPRAZOLE SODIUM 40 MG PO TBEC: 40.0000 mg | DELAYED_RELEASE_TABLET | Freq: Two times a day (BID) | ORAL | Status: DC

## 2015-02-18 ENCOUNTER — Other Ambulatory Visit: Payer: Self-pay | Admitting: *Deleted

## 2015-02-18 MED ORDER — SERTRALINE HCL 100 MG PO TABS: 100.0000 mg | ORAL_TABLET | Freq: Every day | ORAL | Status: DC

## 2015-02-18 NOTE — Telephone Encounter (Signed)
Rx done. 

## 2015-03-09 ENCOUNTER — Other Ambulatory Visit: Payer: Self-pay | Admitting: Family

## 2015-03-09 ENCOUNTER — Telehealth: Payer: Self-pay | Admitting: Family

## 2015-03-09 NOTE — Telephone Encounter (Signed)
Patient would like to be referred to have a colon cleanse.

## 2015-03-09 NOTE — Telephone Encounter (Signed)
Called patient back to make sure that it was a colon cleanse that he wanted instead of a colonoscopy. Patient states that GI told him to call his PCP in order to have this done.

## 2015-03-09 NOTE — Telephone Encounter (Signed)
Pt need to contact his GI regarding that

## 2015-03-10 NOTE — Telephone Encounter (Signed)
As per Jose Reyes he recommend pt to take some laxative but pt refused.

## 2015-07-18 ENCOUNTER — Encounter: Payer: Self-pay | Admitting: Gastroenterology

## 2015-09-14 ENCOUNTER — Ambulatory Visit (INDEPENDENT_AMBULATORY_CARE_PROVIDER_SITE_OTHER): Payer: BLUE CROSS/BLUE SHIELD

## 2015-09-14 ENCOUNTER — Ambulatory Visit (INDEPENDENT_AMBULATORY_CARE_PROVIDER_SITE_OTHER): Payer: BLUE CROSS/BLUE SHIELD | Admitting: Podiatry

## 2015-09-14 ENCOUNTER — Encounter: Payer: Self-pay | Admitting: Podiatry

## 2015-09-14 VITALS — BP 144/91 | HR 80 | Resp 16 | Ht 76.0 in | Wt 210.0 lb

## 2015-09-14 DIAGNOSIS — M779 Enthesopathy, unspecified: Secondary | ICD-10-CM

## 2015-09-14 DIAGNOSIS — M2042 Other hammer toe(s) (acquired), left foot: Secondary | ICD-10-CM

## 2015-09-14 MED ORDER — TRIAMCINOLONE ACETONIDE 10 MG/ML IJ SUSP
10.0000 mg | Freq: Once | INTRAMUSCULAR | Status: AC
  Administered 2015-09-14: 10 mg

## 2015-09-14 MED ORDER — DICLOFENAC SODIUM 75 MG PO TBEC: 75.0000 mg | DELAYED_RELEASE_TABLET | Freq: Two times a day (BID) | ORAL | Status: DC

## 2015-09-14 NOTE — Progress Notes (Signed)
   Subjective:    Patient ID: Jose Reyes, male    DOB: [birth year], 40 y.o.   MRN: [id number]  HPI Chief Complaint  Patient presents with  . Foot Pain    Left foot; plantar forefoot; pt stated, "Pain when walking or running; can still exercise; feels like a bruise or tingling sensation in toes; feels like 2nd & 3rd toe are splitting"; x3 months      Review of Systems  All other systems reviewed and are negative.      Objective:   Physical Exam        Assessment & Plan:

## 2015-09-14 NOTE — Progress Notes (Signed)
Subjective:     Patient ID: Jose Reyes, male   DOB: 1975/12/21, 40 y.o.   MRN: [id number]  HPI patient states I'm having discomfort in my left foot for the last 3 months and it's making it hard for me to walk comfortably. Patient's also noted slight separation between the second and third toes   Review of Systems  All other systems reviewed and are negative.      Objective:   Physical Exam  Constitutional: He is oriented to person, place, and time.  Neck: JVD: eurovascular status intact muscle strength adequate range of motion within normal limits with patient noted to have mild medial dislocation second toe left with inflammation and fluid around the second metatarsophalangeal joint. It is tender when pressed a.  Cardiovascular: Intact distal pulses.   Musculoskeletal: Normal range of motion.  Neurological: He is oriented to person, place, and time.  Skin: Skin is warm.  Nursing note and vitals reviewed.  and he states it does make him walk to the outside of his foot. Patient's also noted to have good digital perfusion and is well oriented 3     Assessment:     Inflammatory capsulitis second MPJ left with possibility for mild flexor plate stretch    Plan:     H&P conditions reviewed and x-ray reviewed with patient. Today I discuss proximal nerve block and explained procedure and risk and I went ahead today and I did a proximal nerve block of the second MPJ and after appropriate numbness and prep of the area I did carefully aspirate the joint getting out a small amount of clear fluid and injected with a quarter cc dexamethasone Kenalog and applied thick padding to reduce pressure. Placed on diclofenac and reappoint to recheck  X-ray report negative for signs of fracture and did reveal slight medial dislocation second toe left

## 2015-10-03 ENCOUNTER — Ambulatory Visit: Payer: BLUE CROSS/BLUE SHIELD | Admitting: Podiatry

## 2016-01-09 ENCOUNTER — Telehealth: Payer: Self-pay | Admitting: Family

## 2016-01-09 NOTE — Telephone Encounter (Signed)
Pt last seen by cory 12/15/14. Pt wants refill of valACYclovir (VALTREX) 1000 MG tablet today.  Pt declined to make an establish appointment with anyone. Pt states he does not need to come in, he only needs this medicine because he is getting a cold sore.  Explained to pt the need to establish, but he said he has no time for that.  [facility] [place]

## 2016-01-10 NOTE — Telephone Encounter (Signed)
If there is a clear indication I will consider continue it. I do not see when was her last follow up for this specific problem, she was seen in 09/2015 for toe issue. We can discuss this during OV, refill is not authorized.   Thanks, BJ

## 2016-01-10 NOTE — Telephone Encounter (Signed)
He will have to have an appt in order for Dr. Martinique to consider filling it.   Thank you!

## 2016-01-10 NOTE — Telephone Encounter (Signed)
Please advise on refill.

## 2016-01-10 NOTE — Telephone Encounter (Signed)
See below

## 2016-01-10 NOTE — Telephone Encounter (Signed)
I am sorry, I cannot refill his medications without him being seen

## 2016-01-10 NOTE — Telephone Encounter (Signed)
Left message on personalized voice mail to call back and established with a provider.

## 2016-01-10 NOTE — Telephone Encounter (Signed)
I contacted patient to give him this information. Patient was very angry and states that he does not have time, and does not have the money to come in for an appointment. He states he just really needs this medication, and he does not understand why he can not get it.  He states that no one has offered him an appointment with a Dr - they have only been offering NP's, and he does not want that. He wants to establish with Dr. Martinique. He wants to know if an appt can be made, and  this medication can be refilled until that appointment.

## 2016-01-11 ENCOUNTER — Other Ambulatory Visit: Payer: Self-pay

## 2016-01-30 ENCOUNTER — Telehealth: Payer: Self-pay | Admitting: *Deleted

## 2016-01-31 NOTE — Telephone Encounter (Signed)
[facility] S[facility]raline stating the pt told them his dose has increased to 1.5 tablets daily.  After reviewing the chart I did not see this dose listed in his medications and he has not been seen in over a year and needs to establish care with a new PCP if he is continuing his treatment here.  I called the pt and informed him of this, told him Dr Maudie Mercury is not taking any new pts at this time and evidently last year approved a short dose for him until he could see someone, and he stated it is our fault that he has been going back and forth with new doctors since Dr Leanne Chang left and he only wants an MD.  I apologized and offered to have someone schedule him with a new physician here and placed him on hold to talk with Constance Holster to schedule the pt and he hung up.  I called the pt back and left a message for him to return my call.

## 2016-02-01 ENCOUNTER — Other Ambulatory Visit: Payer: Self-pay | Admitting: Family Medicine

## 2016-02-01 ENCOUNTER — Other Ambulatory Visit: Payer: Self-pay

## 2016-02-01 MED ORDER — SERTRALINE HCL 100 MG PO TABS: 100.0000 mg | ORAL_TABLET | Freq: Every day | ORAL | 0 refills | Status: DC

## 2016-02-01 NOTE — Telephone Encounter (Signed)
Rx has been sent in. 

## 2016-02-01 NOTE — Telephone Encounter (Signed)
Please advise 

## 2016-02-01 NOTE — Telephone Encounter (Signed)
Pt has made an establish appt with Gregary Signs next Thursday (out of town Monday) np appts today, and pt is out of his sertraline (ZOLOFT) 100 MG tablet  Pt would like a 10 day supply to get him through to his appointment.  Advised pt I would ask, and pt should plan to make the appointment on Thursday with Gregary Signs. Will Tommi Rumps do a 10 day for pt?  [facility] [address]

## 2016-02-01 NOTE — Telephone Encounter (Signed)
He can have 10 days of his current dose of Zoloft

## 2016-02-09 ENCOUNTER — Encounter: Payer: Self-pay | Admitting: Family Medicine

## 2016-02-09 ENCOUNTER — Ambulatory Visit (INDEPENDENT_AMBULATORY_CARE_PROVIDER_SITE_OTHER): Payer: BLUE CROSS/BLUE SHIELD | Admitting: Family Medicine

## 2016-02-09 VITALS — BP 142/88 | HR 76 | Temp 97.5°F | Ht 75.0 in | Wt 206.4 lb

## 2016-02-09 DIAGNOSIS — F411 Generalized anxiety disorder: Secondary | ICD-10-CM | POA: Diagnosis not present

## 2016-02-09 DIAGNOSIS — Z7689 Persons encountering health services in other specified circumstances: Secondary | ICD-10-CM

## 2016-02-09 DIAGNOSIS — Z87448 Personal history of other diseases of urinary system: Secondary | ICD-10-CM

## 2016-02-09 DIAGNOSIS — F329 Major depressive disorder, single episode, unspecified: Secondary | ICD-10-CM

## 2016-02-09 MED ORDER — IMIPRAMINE HCL 10 MG PO TABS: 10.0000 mg | ORAL_TABLET | Freq: Every day | ORAL | 3 refills | Status: DC

## 2016-02-09 NOTE — Progress Notes (Addendum)
Note: this chart was addended on 02/26/19. Jose Reyes is my current patient. On review of previous medical records as well as discussion, it was determined that he was not treated with sertraline for depression or anxiety historically. It was used more to help with his type A personality. He does not historically or currently have a diagnosis of depression or anxiety. The diagnosis of depression (and anxiety where found) will be removed and where mentioned in the body of the note will be struck through. [name], MD   Patient ID: Jose Reyes, male   DOB: 1975/04/24, 40 y.o.   MRN: [id number]  Patient presents to clinic today to establish care.   Chronic Issues: History of nocturnal enuresis starting in early childhood that has been treated with imipramine 10 mg at bedtime. Patient states that he has excellent benefit from this medication and does not use it on a nightly basis. He denies fever, chills, sweats, nighttime voiding, trouble with starting/stopping his stream, dysuria, or hematuria.    that has been controlled with sertraline. He denies suicidal/homocidal ideation or plan, sleep disturbance, appetite changes, or decrease in pleasure/interest in activities at this time. He reports excellent benefit from sertraline.   :  History of anxiety that has been controlled with sertraline. GAD-7 score of 2 indicating mild anxiety.  Sertraline has provided excellent benefit.  Health Maintenance: Dental -- Every 6 months Vision -- Yearly Immunizations -- Declines flu vaccine      Past Medical History:  Diagnosis Date  . Cancer ([place])   . Reflux   . Testicular cancer [facility])     Past Surgical History:  Procedure Laterality Date  . JOINT REPLACEMENT    . TOOTH EXTRACTION      Current Outpatient Prescriptions on File Prior to Visit  Medication Sig Dispense Refill  . diclofenac (VOLTAREN) 75 MG EC tablet Take 1 tablet (75 mg total) by mouth 2 (two) times daily. 50 tablet 2  .  EPINEPHrine (EPIPEN 2-PAK) 0.3 mg/0.3 mL DEVI Inject 0.3 mLs (0.3 mg total) into the muscle once. 1 Device 2  . ibuprofen (ADVIL,MOTRIN) 200 MG tablet Take 200 mg by mouth every 6 (six) hours as needed.      Marland Kitchen imipramine (TOFRANIL) 10 MG tablet TAKE 1 TABLET BY MOUTH AT BEDTIME 30 tablet 3  . magic mouthwash w/lidocaine SOLN Take 5 mLs by mouth 3 (three) times daily as needed for mouth pain. 120 mL 0  . pantoprazole (PROTONIX) 40 MG tablet Take 1 tablet (40 mg total) by mouth 2 (two) times daily. 180 tablet 3  . sertraline (ZOLOFT) 100 MG tablet Take 1 tablet (100 mg total) by mouth daily. 10 tablet 0  . valACYclovir (VALTREX) 1000 MG tablet TAKE 1 TABLET BY MOUTH 3 TIMES A DAY 90 tablet 0   No current facility-administered medications on file prior to visit.     Allergies  Allergen Reactions  . Penicillins     REACTION: unknown    Family History  Problem Relation Age of Onset  . Stomach cancer Father   . Esophageal cancer Father     Social History   Social History  . Marital status: Single    Spouse name: N/A  . Number of children: N/A  . Years of education: N/A   Occupational History  . Not on file.   Social History Main Topics  . Smoking status: Never Smoker  . Smokeless tobacco: Never Used  . Alcohol use Yes     Comment:  occ  . Drug use: No  . Sexual activity: Not on file   Other Topics Concern  . Not on file   Social History Narrative  . No narrative on file    Review of Systems  Constitutional: Negative for chills, fever and malaise/fatigue.  HENT: Negative for congestion.   Respiratory: Negative for cough, sputum production, shortness of breath and wheezing.   Cardiovascular: Negative for chest pain, palpitations and claudication.  Gastrointestinal: Negative for diarrhea, heartburn, nausea and vomiting.  Genitourinary: Negative for dysuria and frequency.  Musculoskeletal: Negative for myalgias.  Skin: Negative for rash.  Neurological: Negative for  dizziness and headaches.  Psychiatric/Behavioral:       Denies depressed or anxious mood     BP (!) 142/88 (BP Location: Left Arm, Patient Position: Sitting, Cuff Size: Normal)   Pulse 76   Temp 97.5 F (36.4 C) (Oral)   Ht 6\' 3"  (1.905 m)   Wt 206 lb 6.4 oz (93.6 kg)   SpO2 99%   BMI 25.80 kg/m   Physical Exam  Constitutional: He is oriented to person, place, and time and well-developed, well-nourished, and in no distress.  HENT:  Left Ear: Tympanic membrane normal.  Nose: No rhinorrhea. Right sinus exhibits no maxillary sinus tenderness and no frontal sinus tenderness. Left sinus exhibits no maxillary sinus tenderness and no frontal sinus tenderness.  Mouth/Throat: Mucous membranes are normal. No oropharyngeal exudate or posterior oropharyngeal erythema.  Eyes: Pupils are equal, round, and reactive to light. No scleral icterus.  Cardiovascular: Normal rate and regular rhythm.   Pulmonary/Chest: Effort normal and breath sounds normal. He has no wheezes. He has no rales.  Abdominal: Soft. Bowel sounds are normal. There is no tenderness.  Lymphadenopathy:    He has no cervical adenopathy.  Neurological: He is alert and oriented to person, place, and time.  Skin: Skin is warm and dry. No rash noted.  Psychiatric: Mood, memory, affect and judgment normal.    No results found for this or any previous visit (from the past 2160 hour(s)).  Assessment/Plan:  1.  Stable; Continue sertraline. Cognitive behavioral therapy will be considered if needed in the future  2. Stable; Continue sertraline.   3. History of nocturnal enuresis Imipramine as needed. Stable; Advised need for CPE and evaluation of need for this medication long term.   4. Encounter to establish care Advised patient to schedule a CPE and lab work within the next 2 months.    We discussed the possibility of increased risk of serotonin syndrome, CNS depression with imipramine and sertraline. Extremely low dose of  imipramine has been prescribed long term and patient denies any adverse effects with this medication. Discussed signs/symptoms of serotonin syndrome and advised patient to report any adverse effects. Advised patient regarding need for CPE and lab work for future refills to be provided.  We reviewed the PMH, PSH, FH, SH, Meds and Allergies. -We provided refills for any medications we will prescribe as needed. -We addressed current concerns per orders and patient instructions. -We have asked for records for pertinent exams, studies, vaccines and notes from previous providers. -We have advised patient to follow up per instructions below.  Delano Metz, FNP-C

## 2016-02-09 NOTE — Progress Notes (Signed)
Pre visit review using our clinic review tool, if applicable. No additional management support is needed unless otherwise documented below in the visit note. 

## 2016-02-09 NOTE — Patient Instructions (Signed)
It was a pleasure to meet you today! Please schedule a physical at your convenience.    DASH Eating Plan DASH stands for "Dietary Approaches to Stop Hypertension." The DASH eating plan is a healthy eating plan that has been shown to reduce high blood pressure (hypertension). Additional health benefits may include reducing the risk of type 2 diabetes mellitus, heart disease, and stroke. The DASH eating plan may also help with weight loss. What do I need to know about the DASH eating plan? For the DASH eating plan, you will follow these general guidelines:  Choose foods with less than 150 milligrams of sodium per serving (as listed on the food label).  Use salt-free seasonings or herbs instead of table salt or sea salt.  Check with your health care provider or pharmacist before using salt substitutes.  Eat lower-sodium products. These are often labeled as "low-sodium" or "no salt added."  Eat fresh foods. Avoid eating a lot of canned foods.  Eat more vegetables, fruits, and low-fat dairy products.  Choose whole grains. Look for the word "whole" as the first word in the ingredient list.  Choose fish and skinless chicken or [place] more often than red meat. Limit fish, poultry, and meat to 6 oz (170 g) each day.  Limit sweets, desserts, sugars, and sugary drinks.  Choose heart-healthy fats.  Eat more home-cooked food and less restaurant, buffet, and fast food.  Limit fried foods.  Do not fry foods. Cook foods using methods such as baking, boiling, grilling, and broiling instead.  When eating at a restaurant, ask that your food be prepared with less salt, or no salt if possible. What foods can I eat? Seek help from a dietitian for individual calorie needs. Grains  Whole grain or whole wheat bread. Brown rice. Whole grain or whole wheat pasta. Quinoa, bulgur, and whole grain cereals. Low-sodium cereals. Corn or whole wheat flour tortillas. Whole grain cornbread. Whole grain crackers.  Low-sodium crackers. Vegetables  Fresh or frozen vegetables (raw, steamed, roasted, or grilled). Low-sodium or reduced-sodium tomato and vegetable juices. Low-sodium or reduced-sodium tomato sauce and paste. Low-sodium or reduced-sodium canned vegetables. Fruits  All fresh, canned (in natural juice), or frozen fruits. Meat and Other Protein Products  Ground beef (85% or leaner), grass-fed beef, or beef trimmed of fat. Skinless chicken or [place]. Ground chicken or [place]. Pork trimmed of fat. All fish and seafood. Eggs. Dried beans, peas, or lentils. Unsalted nuts and seeds. Unsalted canned beans. Dairy  Low-fat dairy products, such as skim or 1% milk, 2% or reduced-fat cheeses, low-fat ricotta or cottage cheese, or plain low-fat yogurt. Low-sodium or reduced-sodium cheeses. Fats and Oils  Tub margarines without trans fats. Light or reduced-fat mayonnaise and salad dressings (reduced sodium). Avocado. Safflower, olive, or canola oils. Natural peanut or almond butter. Other  Unsalted popcorn and pretzels. The items listed above may not be a complete list of recommended foods or beverages. Contact your dietitian for more options.  What foods are not recommended? Grains  White bread. White pasta. White rice. Refined cornbread. Bagels and croissants. Crackers that contain trans fat. Vegetables  Creamed or fried vegetables. Vegetables in a cheese sauce. Regular canned vegetables. Regular canned tomato sauce and paste. Regular tomato and vegetable juices. Fruits  Canned fruit in light or heavy syrup. Fruit juice. Meat and Other Protein Products  Fatty cuts of meat. Ribs, chicken wings, bacon, sausage, bologna, salami, chitterlings, fatback, hot dogs, bratwurst, and packaged luncheon meats. Salted nuts and seeds. Canned beans with  salt. Dairy  Whole or 2% milk, cream, half-and-half, and cream cheese. Whole-fat or sweetened yogurt. Full-fat cheeses or blue cheese. Nondairy creamers and whipped  toppings. Processed cheese, cheese spreads, or cheese curds. Condiments  Onion and garlic salt, seasoned salt, table salt, and sea salt. Canned and packaged gravies. Worcestershire sauce. Tartar sauce. Barbecue sauce. Teriyaki sauce. Soy sauce, including reduced sodium. Steak sauce. Fish sauce. Oyster sauce. Cocktail sauce. Horseradish. Ketchup and mustard. Meat flavorings and tenderizers. Bouillon cubes. Hot sauce. Tabasco sauce. Marinades. Taco seasonings. Relishes. Fats and Oils  Butter, stick margarine, lard, shortening, ghee, and bacon fat. Coconut, palm kernel, or palm oils. Regular salad dressings. Other  Pickles and olives. Salted popcorn and pretzels. The items listed above may not be a complete list of foods and beverages to avoid. Contact your dietitian for more information.  Where can I find more information? National Heart, Lung, and Blood Institute: travelstabloid.com This information is not intended to replace advice given to you by your health care provider. Make sure you discuss any questions you have with your health care provider. Document Released: 02/15/2011 Document Revised: 08/04/2015 Document Reviewed: 12/31/2012 Elsevier Interactive Patient Education  2017 [employer].

## 2016-02-15 ENCOUNTER — Other Ambulatory Visit: Payer: BLUE CROSS/BLUE SHIELD

## 2016-02-16 ENCOUNTER — Other Ambulatory Visit (INDEPENDENT_AMBULATORY_CARE_PROVIDER_SITE_OTHER): Payer: BLUE CROSS/BLUE SHIELD

## 2016-02-16 DIAGNOSIS — Z0000 Encounter for general adult medical examination without abnormal findings: Secondary | ICD-10-CM

## 2016-02-16 LAB — CBC WITH DIFFERENTIAL/PLATELET
BASOS ABS: 0 10*3/uL (ref 0.0–0.1)
Basophils Relative: 0.3 % (ref 0.0–3.0)
EOS ABS: 0.1 10*3/uL (ref 0.0–0.7)
Eosinophils Relative: 2 % (ref 0.0–5.0)
HCT: 42.9 % (ref 39.0–52.0)
HEMOGLOBIN: 14.9 g/dL (ref 13.0–17.0)
LYMPHS ABS: 1.3 10*3/uL (ref 0.7–4.0)
Lymphocytes Relative: 20.7 % (ref 12.0–46.0)
MCHC: 34.6 g/dL (ref 30.0–36.0)
MCV: 90.6 fl (ref 78.0–100.0)
MONO ABS: 0.6 10*3/uL (ref 0.1–1.0)
Monocytes Relative: 9.8 % (ref 3.0–12.0)
NEUTROS PCT: 67.2 % (ref 43.0–77.0)
Neutro Abs: 4.1 10*3/uL (ref 1.4–7.7)
Platelets: 217 10*3/uL (ref 150.0–400.0)
RBC: 4.73 Mil/uL (ref 4.22–5.81)
RDW: 13.2 % (ref 11.5–15.5)
WBC: 6 10*3/uL (ref 4.0–10.5)

## 2016-02-16 LAB — BASIC METABOLIC PANEL
BUN: 12 mg/dL (ref 6–23)
CALCIUM: 9.6 mg/dL (ref 8.4–10.5)
CO2: 30 mEq/L (ref 19–32)
Chloride: 103 mEq/L (ref 96–112)
Creatinine, Ser: 0.99 mg/dL (ref 0.40–1.50)
GFR: 87.9 mL/min (ref >60.00)
GLUCOSE: 94 mg/dL (ref 70–99)
POTASSIUM: 4.3 meq/L (ref 3.5–5.1)
SODIUM: 141 meq/L (ref 135–145)

## 2016-02-16 LAB — HEPATIC FUNCTION PANEL
ALK PHOS: 61 U/L (ref 39–117)
ALT: 19 U/L (ref 0–53)
AST: 21 U/L (ref 0–37)
Albumin: 4.6 g/dL (ref 3.5–5.2)
BILIRUBIN DIRECT: 0.1 mg/dL (ref 0.0–0.3)
BILIRUBIN TOTAL: 0.6 mg/dL (ref 0.2–1.2)
Total Protein: 7.4 g/dL (ref 6.0–8.3)

## 2016-02-16 LAB — POC URINALSYSI DIPSTICK (AUTOMATED)
Bilirubin, UA: NEGATIVE
Blood, UA: NEGATIVE
GLUCOSE UA: NEGATIVE
KETONES UA: NEGATIVE
Leukocytes, UA: NEGATIVE
Nitrite, UA: NEGATIVE
SPEC GRAV UA: 1.015
Urobilinogen, UA: 0.2
pH, UA: 7.5

## 2016-02-16 LAB — LIPID PANEL
CHOL/HDL RATIO: 4
Cholesterol: 221 mg/dL — ABNORMAL HIGH (ref 0–200)
HDL: 54.3 mg/dL (ref >39.00)
LDL CALC: 146 mg/dL — AB (ref 0–99)
NonHDL: 167.05
TRIGLYCERIDES: 105 mg/dL (ref 0.0–149.0)
VLDL: 21 mg/dL (ref 0.0–40.0)

## 2016-02-16 LAB — TSH: TSH: 1.67 u[IU]/mL (ref 0.35–4.50)

## 2016-02-20 ENCOUNTER — Ambulatory Visit (INDEPENDENT_AMBULATORY_CARE_PROVIDER_SITE_OTHER): Payer: BLUE CROSS/BLUE SHIELD | Admitting: Family Medicine

## 2016-02-20 ENCOUNTER — Encounter: Payer: Self-pay | Admitting: Family Medicine

## 2016-02-20 VITALS — BP 148/78 | HR 88 | Temp 98.2°F | Ht 75.0 in | Wt 207.0 lb

## 2016-02-20 DIAGNOSIS — R030 Elevated blood-pressure reading, without diagnosis of hypertension: Secondary | ICD-10-CM | POA: Diagnosis not present

## 2016-02-20 DIAGNOSIS — Z0001 Encounter for general adult medical examination with abnormal findings: Secondary | ICD-10-CM | POA: Diagnosis not present

## 2016-02-20 DIAGNOSIS — K219 Gastro-esophageal reflux disease without esophagitis: Secondary | ICD-10-CM

## 2016-02-20 DIAGNOSIS — Z87448 Personal history of other diseases of urinary system: Secondary | ICD-10-CM

## 2016-02-20 DIAGNOSIS — F329 Major depressive disorder, single episode, unspecified: Secondary | ICD-10-CM | POA: Diagnosis not present

## 2016-02-20 NOTE — Patient Instructions (Signed)
It was a pleasure to see you today! Please continue with your diet and exercise which is working well for you.  Also, keep up with your low salt diet and continue to focus on low fat options as well.  Follow up as needed. We will contact you regarding the urology follow up as we discussed.  Health Maintenance, Male A healthy lifestyle and preventative care can promote health and wellness.  Maintain regular health, dental, and eye exams.  Eat a healthy diet. Foods like vegetables, fruits, whole grains, low-fat dairy products, and lean protein foods contain the nutrients you need and are low in calories. Decrease your intake of foods high in solid fats, added sugars, and salt. Get information about a proper diet from your health care provider, if necessary.  Regular physical exercise is one of the most important things you can do for your health. Most adults should get at least 150 minutes of moderate-intensity exercise (any activity that increases your heart rate and causes you to sweat) each week. In addition, most adults need muscle-strengthening exercises on 2 or more days a week.   Maintain a healthy weight. The body mass index (BMI) is a screening tool to identify possible weight problems. It provides an estimate of body fat based on height and weight. Your health care provider can find your BMI and can help you achieve or maintain a healthy weight. For males 20 years and older:  A BMI below 18.5 is considered underweight.  A BMI of 18.5 to 24.9 is normal.  A BMI of 25 to 29.9 is considered overweight.  A BMI of 30 and above is considered obese.  Maintain normal blood lipids and cholesterol by exercising and minimizing your intake of saturated fat. Eat a balanced diet with plenty of fruits and vegetables. Blood tests for lipids and cholesterol should begin at age 71 and be repeated every 5 years. If your lipid or cholesterol levels are high, you are over age 38, or you are at high risk  for heart disease, you may need your cholesterol levels checked more frequently.Ongoing high lipid and cholesterol levels should be treated with medicines if diet and exercise are not working.  If you smoke, find out from your health care provider how to quit. If you do not use tobacco, do not start.  Lung cancer screening is recommended for adults aged 38-80 years who are at high risk for developing lung cancer because of a history of smoking. A yearly low-dose CT scan of the lungs is recommended for people who have at least a 30-pack-year history of smoking and are current smokers or have quit within the past 15 years. A pack year of smoking is smoking an average of 1 pack of cigarettes a day for 1 year (for example, a 30-pack-year history of smoking could mean smoking 1 pack a day for 30 years or 2 packs a day for 15 years). Yearly screening should continue until the smoker has stopped smoking for at least 15 years. Yearly screening should be stopped for people who develop a health problem that would prevent them from having lung cancer treatment.  If you choose to drink alcohol, do not have more than 2 drinks per day. One drink is considered to be 12 oz (360 mL) of beer, 5 oz (150 mL) of wine, or 1.5 oz (45 mL) of liquor.  Avoid the use of street drugs. Do not share needles with anyone. Ask for help if you need support or instructions  about stopping the use of drugs.  High blood pressure causes heart disease and increases the risk of stroke. High blood pressure is more likely to develop in:  People who have blood pressure in the end of the normal range (100-139/85-89 mm Hg).  People who are overweight or obese.  People who are African American.  If you are 50-61 years of age, have your blood pressure checked every 3-5 years. If you are 39 years of age or older, have your blood pressure checked every year. You should have your blood pressure measured twice-once when you are at a hospital or  clinic, and once when you are not at a hospital or clinic. Record the average of the two measurements. To check your blood pressure when you are not at a hospital or clinic, you can use:  An automated blood pressure machine at a pharmacy.  A home blood pressure monitor.  If you are 64-40 years old, ask your health care provider if you should take aspirin to prevent heart disease.  Diabetes screening involves taking a blood sample to check your fasting blood sugar level. This should be done once every 3 years after age 79 if you are at a normal weight and without risk factors for diabetes. Testing should be considered at a younger age or be carried out more frequently if you are overweight and have at least 1 risk factor for diabetes.  Colorectal cancer can be detected and often prevented. Most routine colorectal cancer screening begins at the age of 77 and continues through age 39. However, your health care provider may recommend screening at an earlier age if you have risk factors for colon cancer. On a yearly basis, your health care provider may provide home test kits to check for hidden blood in the stool. A small camera at the end of a tube may be used to directly examine the colon (sigmoidoscopy or colonoscopy) to detect the earliest forms of colorectal cancer. Talk to your health care provider about this at age 56 when routine screening begins. A direct exam of the colon should be repeated every 5-10 years through age 73, unless early forms of precancerous polyps or small growths are found.  People who are at an increased risk for hepatitis B should be screened for this virus. You are considered at high risk for hepatitis B if:  You were born in a country where hepatitis B occurs often. Talk with your health care provider about which countries are considered high risk.  Your parents were born in a high-risk country and you have not received a shot to protect against hepatitis B (hepatitis B  vaccine).  You have HIV or AIDS.  You use needles to inject street drugs.  You live with, or have sex with, someone who has hepatitis B.  You are a man who has sex with other men (MSM).  You get hemodialysis treatment.  You take certain medicines for conditions like cancer, organ transplantation, and autoimmune conditions.  Hepatitis C blood testing is recommended for all people born from [birth year] through 1967 and any individual with known risk factors for hepatitis C.  Healthy men should no longer receive prostate-specific antigen (PSA) blood tests as part of routine cancer screening. Talk to your health care provider about prostate cancer screening.  Testicular cancer screening is not recommended for adolescents or adult males who have no symptoms. Screening includes self-exam, a health care provider exam, and other screening tests. Consult with your health care provider  about any symptoms you have or any concerns you have about testicular cancer.  Practice safe sex. Use condoms and avoid high-risk sexual practices to reduce the spread of sexually transmitted infections (STIs).  You should be screened for STIs, including gonorrhea and chlamydia if:  You are sexually active and are younger than 24 years.  You are older than 24 years, and your health care provider tells you that you are at risk for this type of infection.  Your sexual activity has changed since you were last screened, and you are at an increased risk for chlamydia or gonorrhea. Ask your health care provider if you are at risk.  If you are at risk of being infected with HIV, it is recommended that you take a prescription medicine daily to prevent HIV infection. This is called pre-exposure prophylaxis (PrEP). You are considered at risk if:  You are a man who has sex with other men (MSM).  You are a heterosexual man who is sexually active with multiple partners.  You take drugs by injection.  You are sexually active  with a partner who has HIV.  Talk with your health care provider about whether you are at high risk of being infected with HIV. If you choose to begin PrEP, you should first be tested for HIV. You should then be tested every 3 months for as long as you are taking PrEP.  Use sunscreen. Apply sunscreen liberally and repeatedly throughout the day. You should seek shade when your shadow is shorter than you. Protect yourself by wearing long sleeves, pants, a wide-brimmed hat, and sunglasses year round whenever you are outdoors.  Tell your health care provider of new moles or changes in moles, especially if there is a change in shape or color. Also, tell your health care provider if a mole is larger than the size of a pencil eraser.  A one-time screening for abdominal aortic aneurysm (AAA) and surgical repair of large AAAs by ultrasound is recommended for men aged 58-75 years who are current or former smokers.  Stay current with your vaccines (immunizations). This information is not intended to replace advice given to you by your health care provider. Make sure you discuss any questions you have with your health care provider. Document Released: 08/25/2007 Document Revised: 03/19/2014 Document Reviewed: 11/30/2014 Elsevier Interactive Patient Education  2017 [employer].

## 2016-02-20 NOTE — Progress Notes (Signed)
Pre visit review using our clinic review tool, if applicable. No additional management support is needed unless otherwise documented below in the visit note. 

## 2016-02-20 NOTE — Progress Notes (Addendum)
Note: this chart was addended on 02/26/19. Mr Kerchner is my current patient. On review of previous medical records as well as discussion, it was determined that he was not treated with sertraline for depression or anxiety historically. It was used more to help with his type A personality. He does not historically or currently have a diagnosis of depression or anxiety. The diagnosis of depression (and anxiety where found) will be removed and where mentioned in the body of the note will be struck through. [name], MD  Subjective:    Patient ID: [name] Perella, male    DOB: 1976-01-31, 40 y.o.   MRN: [id number]  HPI  Mr. Salmela is a 40 year old male who presents today for annual physical exam.  Chronic Issues:  :  Well controlled with sertraline. He reports excellent benefit with this medication and denies suicidal/homicidal ideation or plan, or changes in sleep or appetite.     History of nocturnal enuresis:  Childhood history of nocturnal enuresis in early childhood which is controlled and treated successfully with imipramine.   Exercise with treadmill 30 minutes 4 to 5 times/week, lift weight, and walk dog daily without cardiopulmonary symptoms   GERD:  Controlled with pantoprazole. He reports trigger of alcohol use and states that he would like to focus on improving this so he can possibly discontinue use of pantoprazole.   Mildly elevated systolic blood pressure noted. Retake of BP is 142/88. He does not monitor his BP at home. He reports a low salt/low fat diet.  He reports that he just completed drinking coffee prior to this visit.   Review of Systems   Constitutional: No fever, chills, significant weight change, fatigue, weakness or night sweats Eyes: No redness, discharge, pain, blurred vision, double vision, or loss of vision ENT/mouth: No nasal congestion, postnasal drainage,epistaxis, purulent discharge, earache, hearing loss, tinnitus ,sore throat , dental pain, or  hoarseness   Cardiovascular: no chest pain, palpitations, racing, irregular rhythm, syncope, nausea, sweating, claudication, or edema  Respiratory: No cough, sputum production,hemoptysis,  dyspnea, paroxysmal nocturnal dyspnea, pleuritic chest pain, significant snoring, or  apnea    Gastrointestinal: No heartburn,dysphagia, nausea and vomiting,ominal pain, change in bowels, anorexia, diarrhea, significant constipation, rectal bleeding, melena,  stool incontinence or jaundice Genitourinary: No dysuria,hematuria, pyuria, frequency, urgency,  incontinence, nocturia, dark urine or flank pain Musculoskeletal: No myalgias or muscle cramping, joint stiffness, joint swelling, joint color change, weakness, or cyanosis Dermatologic: No rash, pruritus, urticaria, or change in color or temperature of skin Neurologic: No headache, vertigo, limb weakness, tremor, gait disturbance, seizures, memory loss, numbness or tingling Psychiatric: No significant anxiety or depression, anhedonia, panic attacks, insomnia, or anorexia Endocrine: No change in hair/skin/ nails, excessive thirst, excessive hunger, excessive urination, or unexplained fatigue Hematologic/lymphatic: No bruising, lymphadenopathy,or  abnormal clotting Allergy/immunology: No itchy/ watery eyes, abnormal sneezing, rhinitis, urticaria ,or angioedema     Objective:   Physical Exam  Physical Exam  Constitutional: He is oriented to person, place, and time. He appears well-developed and well-nourished. No distress.  HENT:  Head: Normocephalic and atraumatic.  Right Ear: Tympanic membrane and ear canal normal.  Left Ear: Tympanic membrane and ear canal normal.  Mouth/Throat: Oropharynx is clear and moist.  Eyes: Pupils are equal, round, and reactive to light. No scleral icterus.  Neck: Normal range of motion. No thyromegaly present.  Cardiovascular: Normal rate and regular rhythm.   No murmur heard. Pulmonary/Chest: Effort normal and breath sounds  normal. No respiratory distress. He has  no wheezes. He has no rales. He exhibits no tenderness.  Abdominal: Soft. Bowel sounds are normal. He exhibits no distension and no mass. There is no tenderness. There is no rebound and no guarding.  Musculoskeletal: He exhibits no edema.  Lymphadenopathy:    He has no cervical adenopathy.  Neurological: He is alert and oriented to person, place, and time. He has normal reflexes. He exhibits normal muscle tone. Coordination normal.  Skin: Skin is warm and dry.  Psychiatric: He has a normal mood and affect. His behavior is normal. Judgment and thought content normal.      Assessment & Plan:  1. Encounter for general adult medical examination with abnormal findings 40 y.o. male presenting for annual physical.  Health Maintenance counseling: 1. Anticipatory guidance: Patient counseled regarding regular dental exams, eye exams, wearing seatbelts.  2. Risk factor reduction:  Advised patient of need for regular exercise and diet rich and fruits and vegetables to reduce risk of heart attack and stroke.  3. Immunizations/screenings/ancillary studies Immunization History  Administered Date(s) Administered  . Influenza Split 03/21/2011, 01/23/2012  . Tdap 01/23/2012    4. Prostate cancer screening-  Screening to start at age 65. He is also planning to follow up with urology as was recommended after a remote history of completed treatment for testicular cancer and long term use of imipramine for enuresis 5. Colon cancer screening - No needed until screening at age 67  He declined influenza vaccine today.  2. Elevated blood-pressure reading without diagnosis of hypertension Retake of BP demonstrated decrease. Advised home monitoring of BP and report readings >140/90. We discussed the importance of determining average blood pressure and that he should take his BP daily for 1 to 2 weeks and determine average reading. Also, advised DASH diet and recommended  continued exercise.  3. Gastroesophageal reflux disease, esophagitis presence not specified Advised avoidance of triggers with long term goal of decreasing use of pantoprazole for symptoms. He is motivated for dietary changes and decrease in alcohol intake.   4.  Controlled; continue sertraline as prescribed.   5. History of nocturnal enuresis Long term use of imipramine and remote history of testicular cancer. Prior urologist is no longer practicing. Referral to urology placed for regular follow up of these conditions. - Ambulatory referral to Urology  Review of lab work was completed with calculation for ASCVD risk estimator score of 1.8% which does not indicate need for statin therapy. All other labs were normal.  Delano Metz, FNP-C   Follow up in one year for physical or sooner if needed.

## 2016-04-02 ENCOUNTER — Telehealth: Payer: Self-pay | Admitting: Family Medicine

## 2016-04-02 ENCOUNTER — Other Ambulatory Visit: Payer: Self-pay

## 2016-04-02 MED ORDER — VALACYCLOVIR HCL 1 G PO TABS: 1000.0000 mg | ORAL_TABLET | Freq: Three times a day (TID) | ORAL | 1 refills | Status: DC

## 2016-04-02 NOTE — Telephone Encounter (Signed)
Please refill valcyclovir for patient.

## 2016-04-02 NOTE — Telephone Encounter (Signed)
Pt has cold sore and needs refill on valacyclovir 1000 mg #[facility]. [facility] [address]

## 2016-04-02 NOTE — Telephone Encounter (Signed)
Rx for Valacyclovir 1000 mg  was sent to the patients pharmacy. Patient is aware.

## 2016-05-04 ENCOUNTER — Other Ambulatory Visit: Payer: Self-pay | Admitting: Family Medicine

## 2016-05-08 ENCOUNTER — Other Ambulatory Visit: Payer: Self-pay

## 2016-05-09 ENCOUNTER — Telehealth: Payer: Self-pay

## 2016-05-09 NOTE — Telephone Encounter (Signed)
It appears that he should have one month month of medication before a refill is needed. Please contact patient and ask if he is taking this differently as prescribed. Also, please remind him that he should not increase this medication along with his other medications particularly sertraline that can place him at risk for serotonin syndrome. We discussed this at his last visit. Will review refill after this information has been obtained.

## 2016-05-10 NOTE — Telephone Encounter (Signed)
Called patient stated that he was good on refills for his Imipramine HCL 10 mg and did not need any until end of the month (March).

## 2016-05-16 ENCOUNTER — Other Ambulatory Visit: Payer: Self-pay

## 2016-05-16 MED ORDER — IMIPRAMINE HCL 10 MG PO TABS: 10.0000 mg | ORAL_TABLET | Freq: Every day | ORAL | 1 refills | Status: DC

## 2016-06-19 ENCOUNTER — Encounter: Payer: Self-pay | Admitting: Adult Health

## 2016-06-19 ENCOUNTER — Ambulatory Visit (INDEPENDENT_AMBULATORY_CARE_PROVIDER_SITE_OTHER): Payer: BLUE CROSS/BLUE SHIELD | Admitting: Adult Health

## 2016-06-19 ENCOUNTER — Telehealth: Payer: Self-pay | Admitting: Family Medicine

## 2016-06-19 ENCOUNTER — Other Ambulatory Visit: Payer: Self-pay | Admitting: Adult Health

## 2016-06-19 VITALS — BP 120/66 | Temp 98.2°F | Wt 216.0 lb

## 2016-06-19 DIAGNOSIS — K648 Other hemorrhoids: Secondary | ICD-10-CM

## 2016-06-19 MED ORDER — HYDROCORTISONE ACETATE 25 MG RE SUPP: 25.0000 mg | Freq: Two times a day (BID) | RECTAL | 0 refills | Status: AC

## 2016-06-19 NOTE — Telephone Encounter (Signed)
Have him try [facility] or [facility]. He can get them OTC and they should not be more than 10 dollars a box

## 2016-06-19 NOTE — Telephone Encounter (Signed)
Patient notified and verbalized understanding. 

## 2016-06-19 NOTE — Telephone Encounter (Signed)
Is there an alternative you can Rx or recommend?

## 2016-06-19 NOTE — Progress Notes (Signed)
Subjective:    Patient ID: Jose Reyes, male    DOB: 04/08/1975, 41 y.o.   MRN: [id number]  HPI  41 year old male who  has a past medical history of Cancer ([place]); Reflux; and Testicular cancer ([place]). He presents to the office today for the issue of rectal itching. He denies any bleeding or pain. Feels as though " I may have pin worms." Denies traveling outside of the country    Review of Systems See HPI   Past Medical History:  Diagnosis Date  . Cancer ([place])   . Reflux   . Testicular cancer [facility])     Social History   Social History  . Marital status: Single    Spouse name: N/A  . Number of children: N/A  . Years of education: N/A   Occupational History  . Not on file.   Social History Main Topics  . Smoking status: Never Smoker  . Smokeless tobacco: Never Used  . Alcohol use 6.0 oz/week    10 Cans of beer per week     Comment: occ  . Drug use: No  . Sexual activity: Yes   Other Topics Concern  . Not on file   Social History Narrative  . No narrative on file    Past Surgical History:  Procedure Laterality Date  . JOINT REPLACEMENT    . TOOTH EXTRACTION      Family History  Problem Relation Age of Onset  . Stomach cancer Father   . Esophageal cancer Father   . Heart attack Paternal Grandfather 60    Allergies  Allergen Reactions  . Penicillins     REACTION: unknown    Current Outpatient Prescriptions on File Prior to Visit  Medication Sig Dispense Refill  . pantoprazole (PROTONIX) 40 MG tablet Take 1 tablet (40 mg total) by mouth 2 (two) times daily. 180 tablet 3  . sertraline (ZOLOFT) 100 MG tablet TAKE 1 TABLET DAILY 90 tablet 1  . EPINEPHrine (EPIPEN 2-PAK) 0.3 mg/0.3 mL DEVI Inject 0.3 mLs (0.3 mg total) into the muscle once. (Patient not taking: Reported on 06/19/2016) 1 Device 2  . valACYclovir (VALTREX) 1000 MG tablet Take 1 tablet (1,000 mg total) by mouth 3 (three) times daily. (Patient not taking: Reported on 06/19/2016) 90 tablet 1    No current facility-administered medications on file prior to visit.     BP 120/66 (BP Location: Left Arm, Patient Position: Sitting, Cuff Size: Large)   Temp 98.2 F (36.8 C) (Oral)   Wt 216 lb (98 kg)   BMI 27.00 kg/m       Objective:   Physical Exam  Constitutional: He is oriented to person, place, and time. He appears well-developed and well-nourished. No distress.  Abdominal: Soft. Bowel sounds are normal. He exhibits no distension and no mass. There is no tenderness. There is no rebound and no guarding.  Genitourinary: Rectal exam shows internal hemorrhoid. Rectal exam shows no external hemorrhoid and no tenderness.  Neurological: He is alert and oriented to person, place, and time.  Skin: Skin is warm and dry. No rash noted. He is not diaphoretic. No erythema. No pallor.  Psychiatric: He has a normal mood and affect. His behavior is normal. Judgment and thought content normal.  Nursing note and vitals reviewed.     Assessment & Plan:  1. Internal hemorrhoid - hydrocortisone (ANUSOL-HC) 25 MG suppository; Place 1 suppository (25 mg total) rectally 2 (two) times daily.  Dispense: 60 suppository; Refill: 0 -  Consider referral to GI if no improvement   Dorothyann Peng, NP

## 2016-06-19 NOTE — Telephone Encounter (Signed)
Pt calling stating that the medication that was given to him today for hemorrhoids is to expensive and would like to see if there is an alternative for the medication maybe a pill or a suppository so that he can get rid of the pain.  [facility] [address]

## 2016-07-07 ENCOUNTER — Other Ambulatory Visit: Payer: Self-pay | Admitting: Gastroenterology

## 2016-07-17 ENCOUNTER — Telehealth: Payer: Self-pay | Admitting: Gastroenterology

## 2016-07-18 NOTE — Telephone Encounter (Signed)
Informed patient that he is due for a follow up visit and he does have to be seen every year if he is getting prescription refills from Dr. Fuller Plan. Patient states he is doing fine and has to pay out of pocket to come into the office and does not understand why he needs to come into the office if he has no complaints. Explained to patient that he does have to see [place] every year if it is prescribed by Dr. Fuller Plan but he can get his PCP to prescribe it if he does not have to see them as often. Patient states he might call them but really wish we would just send it to his pharmacy instead. Patient wants me to send message to doctor to find out if he can a few more refills and maybe be seen every other year in the office. Please advise.

## 2016-07-18 NOTE — Telephone Encounter (Signed)
Informed patient that he can get his PCP to fill the medication or he can take a PPI over the counter daily. Patient agreed that he will just take an OTC medication.

## 2016-07-18 NOTE — Telephone Encounter (Signed)
As alternatives to seeing me on a yearly basis for refills he can take an OTC PPI instead of prescription PPI or have his PCP refill his prescription PPI.

## 2016-10-28 ENCOUNTER — Other Ambulatory Visit: Payer: Self-pay | Admitting: Family Medicine

## 2017-02-02 ENCOUNTER — Other Ambulatory Visit: Payer: Self-pay | Admitting: Family Medicine

## 2017-02-04 NOTE — Telephone Encounter (Signed)
Pt has an Appointment to establish care with Dr Volanda Napoleon on 02/06/2017 at 11 am.

## 2017-02-06 ENCOUNTER — Ambulatory Visit: Payer: BLUE CROSS/BLUE SHIELD | Admitting: Family Medicine

## 2017-04-28 ENCOUNTER — Other Ambulatory Visit: Payer: Self-pay | Admitting: Family Medicine

## 2017-05-03 ENCOUNTER — Other Ambulatory Visit: Payer: Self-pay | Admitting: Family Medicine

## 2017-05-03 NOTE — Telephone Encounter (Signed)
Patient checking status, please advise °

## 2017-05-03 NOTE — Telephone Encounter (Signed)
Okay to refill x3 months? Patient notified by Aggie Hacker, RN that he will need to establish care with another provider for further refills after this one.

## 2017-05-04 NOTE — Telephone Encounter (Signed)
Okay to refill? 

## 2017-05-06 MED ORDER — SERTRALINE HCL 100 MG PO TABS: 100.0000 mg | ORAL_TABLET | Freq: Every day | ORAL | 0 refills | Status: DC

## 2017-05-06 NOTE — Telephone Encounter (Signed)
Medication filled to pharmacy as requested.   

## 2017-05-06 NOTE — Addendum Note (Signed)
Addended by: Dorrene German on: 05/06/2017 07:37 AM   Modules accepted: Orders

## 2017-05-06 NOTE — Telephone Encounter (Signed)
Rx was filled by PCP on 05/04/17. Called pharmacy and canceled the refill that I sent in today.

## 2017-05-28 NOTE — Telephone Encounter (Signed)
Okay to refill x90 days? Patient was previously notified last month that he will need to establish with another provider for further refills.

## 2017-05-29 NOTE — Telephone Encounter (Signed)
He noted using this previously prn for cold sores. So, 90 days is not needed. Okay to refill for 7 days or 21 tablets. If he is experiencing symptoms, advise that he make an appointment for evaluation.  Thank you

## 2017-05-30 NOTE — Telephone Encounter (Signed)
Rx sent to pharmacy   

## 2017-06-02 ENCOUNTER — Other Ambulatory Visit: Payer: Self-pay | Admitting: Family Medicine

## 2017-07-28 ENCOUNTER — Other Ambulatory Visit: Payer: Self-pay | Admitting: Family Medicine

## 2017-11-04 ENCOUNTER — Telehealth: Payer: Self-pay | Admitting: Family Medicine

## 2017-11-04 NOTE — Telephone Encounter (Signed)
Sertraline 100 mg refill Last Refill:07/28/17 # 90 Last OV: 06/19/16 PCP: Delano Metz  NP [phone]  NOV  11/04/18

## 2017-11-04 NOTE — Telephone Encounter (Signed)
Copied from [place] [id number]. Topic: Quick Communication - Rx Refill/Question >> Nov 04, 2017  1:31 PM Selinda Flavin B, NT wrote: Medication: sertraline (ZOLOFT) 100 MG tablet -- Requesting 90 day supply**  Has the patient contacted their pharmacy? Yes.   (Agent: If no, request that the patient contact the pharmacy for the refill.) (Agent: If yes, when and what did the pharmacy advise?)  Preferred Pharmacy (with phone number or street name): [id number] [place], [address] [facility] [address]  Agent: Please be advised that RX refills may take up to 3 business days. We ask that you follow-up with your pharmacy.

## 2017-11-05 NOTE — Telephone Encounter (Signed)
I spoke with pt, he will notify [facility] in [place] to request this refill.

## 2017-11-06 ENCOUNTER — Ambulatory Visit (INDEPENDENT_AMBULATORY_CARE_PROVIDER_SITE_OTHER): Payer: Self-pay | Admitting: Family Medicine

## 2017-11-06 ENCOUNTER — Encounter: Payer: Self-pay | Admitting: Family Medicine

## 2017-11-06 VITALS — BP 138/80 | HR 76 | Temp 98.3°F | Ht 76.0 in | Wt 222.3 lb

## 2017-11-06 DIAGNOSIS — Z131 Encounter for screening for diabetes mellitus: Secondary | ICD-10-CM

## 2017-11-06 DIAGNOSIS — Z731 Type A behavior pattern: Secondary | ICD-10-CM

## 2017-11-06 DIAGNOSIS — Z0000 Encounter for general adult medical examination without abnormal findings: Secondary | ICD-10-CM

## 2017-11-06 DIAGNOSIS — E785 Hyperlipidemia, unspecified: Secondary | ICD-10-CM

## 2017-11-06 DIAGNOSIS — Z8547 Personal history of malignant neoplasm of testis: Secondary | ICD-10-CM

## 2017-11-06 MED ORDER — SERTRALINE HCL 100 MG PO TABS: 100.0000 mg | ORAL_TABLET | Freq: Every day | ORAL | 1 refills | Status: DC

## 2017-11-06 NOTE — Progress Notes (Signed)
Jose Reyes DOB: [birth year] Encounter date: 11/06/2017  This isa 43 y.o. male who presents to establish care. Chief Complaint  Patient presents with  . Establish Care    no new concerns    History of present illness:  Hasn't had physical in awhile. Would like to get bloodwork as well.   Alcohol hasn't been issue but does enjoy 2-3 beers/night typically.   History of testicular cancer; hasn't followed with urology for at least 5 years. Has some tissue on testicle that is left, but this isn't new. Has noted for years. Not enlarging. Soft.   Has been on zoloft for years. Helps to calm him some. Doesn't feel like he has had depression; doesn't feel like he is anxious. Just helps keep him more level.  Past Medical History:  Diagnosis Date  . Reflux   . Testicular cancer ([place]) 1997   treated with surgery x 2, chemo.    Past Surgical History:  Procedure Laterality Date  . ANTERIOR CRUCIATE LIGAMENT REPAIR Right 2005  . 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 testicular Right    cancer  . TOOTH EXTRACTION     wisdom teeth and tooth injury   Allergies  Allergen Reactions  . Bee Venom   . Penicillins     REACTION: unknown   Current Meds  Medication Sig  . EPINEPHrine (EPIPEN 2-PAK) 0.3 mg/0.3 mL DEVI Inject 0.3 mLs (0.3 mg total) into the muscle once.  . sertraline (ZOLOFT) 100 MG tablet Take 1 tablet (100 mg total) by mouth daily.  . valACYclovir (VALTREX) 1000 MG tablet TAKE 1 TABLET (1,000 MG TOTAL) BY MOUTH 3 (THREE) TIMES DAILY.  . [DISCONTINUED] pantoprazole (PROTONIX) 40 MG tablet Take 1 tablet (40 mg total) by mouth 2 (two) times daily.  . [DISCONTINUED] sertraline (ZOLOFT) 100 MG tablet Take 1 tablet (100 mg total) by mouth daily.   Social History   Tobacco Use  . Smoking status: Never Smoker  . Smokeless tobacco: Never Used  Substance Use Topics  . Alcohol use: Yes    Alcohol/week: 10.0 standard drinks    Types: 10 Cans of beer  per week    Comment: occ   Family History  Problem Relation Age of Onset  . Stomach cancer Father   . Esophageal cancer Father   . Heart attack Paternal Grandfather 39  . CAD Maternal Grandmother   . Cancer Maternal Grandfather   . Melanoma Paternal Grandmother 23     Review of Systems  Constitutional: Negative for activity change, appetite change, chills, fatigue, fever and unexpected weight change.  HENT: Negative for congestion, ear pain, hearing loss, sinus pressure, sinus pain, sore throat and trouble swallowing.   Eyes: Negative for pain and visual disturbance.  Respiratory: Negative for cough, chest tightness, shortness of breath and wheezing.   Cardiovascular: Negative for chest pain, palpitations and leg swelling.  Gastrointestinal: Negative for abdominal distention, abdominal pain, blood in stool, constipation, diarrhea, nausea and vomiting.  Genitourinary: Negative for decreased urine volume, difficulty urinating, dysuria, penile pain, scrotal swelling and testicular pain.       Has noted lump on left testicle, soft. He was not able to find this while in the office.  Musculoskeletal: Positive for arthralgias (esp knees). Negative for back pain and joint swelling.  Skin: Negative for rash.       Has sun damage; has seen derm in past. Not seeing regularly. No new lesions/moles that are concerning.  Neurological: Negative for dizziness, weakness, numbness and headaches.  Hematological: Negative for adenopathy. Does not bruise/bleed easily.  Psychiatric/Behavioral: Negative for agitation, sleep disturbance and suicidal ideas. The patient is not nervous/anxious.     Objective:  BP 138/80 (BP Location: Left Arm, Patient Position: Sitting, Cuff Size: Large)   Pulse 76   Temp 98.3 F (36.8 C) (Oral)   Ht 6\' 4"  (1.93 m)   Wt 222 lb 4.8 oz (100.8 kg)   SpO2 98%   BMI 27.06 kg/m   Weight: 222 lb 4.8 oz (100.8 kg)   BP Readings from Last 3 Encounters:  11/06/17 138/80   06/19/16 120/66  02/20/16 (!) 148/78   Wt Readings from Last 3 Encounters:  11/06/17 222 lb 4.8 oz (100.8 kg)  06/19/16 216 lb (98 kg)  02/20/16 207 lb (93.9 kg)    Physical Exam  Constitutional: He is oriented to person, place, and time. He appears well-developed and well-nourished. No distress.  HENT:  Head: Normocephalic and atraumatic.  Right Ear: External ear normal.  Left Ear: External ear normal.  Nose: Nose normal.  Mouth/Throat: Oropharynx is clear and moist. No oropharyngeal exudate.  Eyes: Pupils are equal, round, and reactive to light. Conjunctivae are normal.  Neck: Neck supple. No thyromegaly present.  Cardiovascular: Normal rate, regular rhythm, normal heart sounds and intact distal pulses. Exam reveals no gallop and no friction rub.  No murmur heard. Pulmonary/Chest: Effort normal and breath sounds normal. No stridor. No respiratory distress. He has no wheezes. He has no rales.  Abdominal: Soft. Bowel sounds are normal. He exhibits no distension and no mass. There is no tenderness. There is no guarding. Hernia confirmed negative in the right inguinal area and confirmed negative in the left inguinal area.  Central abd scar present  Genitourinary: Penis normal. Left testis shows no mass, no swelling and no tenderness. Left testis is descended. Circumcised.  Genitourinary Comments: Right testis is surgically absent.   Musculoskeletal: Normal range of motion.  Neurological: He is alert and oriented to person, place, and time.  Skin: Skin is warm and dry.  Sun damage cheeks, forehead. Multiple skin freckles/moles without any significant for irregularity in shape/color/large size.   Psychiatric: He has a normal mood and affect. His behavior is normal. Judgment and thought content normal. Cognition and memory are normal.    Assessment/Plan: 1. Preventative health care Keep up with regular exercise.   2. Hyperlipidemia, unspecified hyperlipidemia type - Lipid panel;  Future - Lipid panel  3. Screening for diabetes mellitus - Comprehensive metabolic panel; Future - Comprehensive metabolic panel  4. History of testicular cancer Records from urology/oncology unable to be accessed. Recommend follow up for any surveillance monitoring that is recommended.  - Ambulatory referral to Urology  5. Type A personality Feels that mood is well controlled with zoloft. Continue at current dose.  - sertraline (ZOLOFT) 100 MG tablet; Take 1 tablet (100 mg total) by mouth daily.  Dispense: 90 tablet; Refill: 1  Return for pending bloodwork.  [name], MD

## 2017-11-07 LAB — LIPID PANEL
CHOLESTEROL: 241 mg/dL — AB (ref 0–200)
HDL: 49.7 mg/dL (ref >39.00)
LDL CALC: 171 mg/dL — AB (ref 0–99)
NonHDL: 190.94
TRIGLYCERIDES: 102 mg/dL (ref 0.0–149.0)
Total CHOL/HDL Ratio: 5
VLDL: 20.4 mg/dL (ref 0.0–40.0)

## 2017-11-07 LAB — COMPREHENSIVE METABOLIC PANEL
ALBUMIN: 4.3 g/dL (ref 3.5–5.2)
ALK PHOS: 54 U/L (ref 39–117)
ALT: 24 U/L (ref 0–53)
AST: 23 U/L (ref 0–37)
BILIRUBIN TOTAL: 0.6 mg/dL (ref 0.2–1.2)
BUN: 18 mg/dL (ref 6–23)
CALCIUM: 9.3 mg/dL (ref 8.4–10.5)
CHLORIDE: 103 meq/L (ref 96–112)
CO2: 31 mEq/L (ref 19–32)
Creatinine, Ser: 0.97 mg/dL (ref 0.40–1.50)
GFR: 89.27 mL/min (ref >60.00)
Glucose, Bld: 100 mg/dL — ABNORMAL HIGH (ref 70–99)
Potassium: 4.5 mEq/L (ref 3.5–5.1)
Sodium: 140 mEq/L (ref 135–145)
TOTAL PROTEIN: 6.9 g/dL (ref 6.0–8.3)

## 2017-11-08 ENCOUNTER — Ambulatory Visit: Payer: BLUE CROSS/BLUE SHIELD | Admitting: Family Medicine

## 2017-11-14 ENCOUNTER — Other Ambulatory Visit: Payer: Self-pay

## 2017-11-14 DIAGNOSIS — Z731 Type A behavior pattern: Secondary | ICD-10-CM

## 2018-01-17 ENCOUNTER — Other Ambulatory Visit: Payer: Self-pay | Admitting: Family Medicine

## 2018-01-17 ENCOUNTER — Telehealth: Payer: Self-pay | Admitting: Family Medicine

## 2018-01-17 NOTE — Telephone Encounter (Signed)
I spoke with Mr. Foutz this evening. We reviewed whether there was need for urology referral. There is some hx of nocturnal enuresis controlled with impramine (uses intermittently) and there is the history of testicular cancer but this was followed up with oncology. He was seen through the cancer center and states that he had 10 years of follow up and then they gave him option to continue with further follow up. I do not feel that he needs to follow up with urology at this point, but would like to have his oncology records on file and have asked him to sign a record release for these at his convenience. I have encouraged him to let us know if any other concerns arise.    Ria Comment: please mail him record release for the Cancer center to complete and return  Neoma Laming: can you please cancel out previous urology referrals? Thank you!

## 2018-01-17 NOTE — Telephone Encounter (Signed)
-----   Message from Raeanne Gathers sent at 01/17/2018 11:23 AM EST ----- Regarding: Referral Removal  Patient would like the urology referral removed to the current time being. Patient states that he is unable to afford the visit due to the fact he does not have health insurance, but him having an open referral is keeping him from being approved for insurance. Please advise. Thanks!

## 2018-01-20 NOTE — Telephone Encounter (Signed)
release has been mailed.

## 2018-01-28 ENCOUNTER — Telehealth: Payer: Self-pay

## 2018-01-28 NOTE — Telephone Encounter (Signed)
Copied from [place] [id number]. Topic: General - Other >> Jan 27, 2018  5:04 PM Percell Belt A wrote: CRM for notification. See Telephone encounter for: 01/27/18.  Pt called in stated that he had been talking to [place] about a situation and would like to talk to him.   Best number is [phone] :) >> Jan 28, 2018 10:58 AM Yvette Rack wrote: Pt stated he really needs Rachel Bo to return his call. Pt requests call back. Cb# [phone]

## 2018-01-28 NOTE — Telephone Encounter (Signed)
Pt calling again - states it is very important that he speak with Rachel Bo about his health records and health insurance status.  States that it is too much to review again and he wants a call back from [place].

## 2018-01-28 NOTE — Telephone Encounter (Signed)
Dustin informed via skype

## 2018-01-29 NOTE — Telephone Encounter (Signed)
Jose Reyes has spoken with pt. Nothing further needed at this time.

## 2018-02-11 ENCOUNTER — Telehealth: Payer: Self-pay

## 2018-02-11 NOTE — Telephone Encounter (Signed)
Recived a fax from [facility] asking for refills on Imipramine HCL 10mg .  Rx is not on current med list. Last filled 05/16/16 Last OV 11/06/17  Ok to fill?

## 2018-02-12 MED ORDER — IMIPRAMINE HCL 10 MG PO TABS: 10.0000 mg | ORAL_TABLET | Freq: Every day | ORAL | 1 refills | Status: DC

## 2018-02-12 NOTE — Telephone Encounter (Signed)
ok 

## 2018-02-12 NOTE — Telephone Encounter (Signed)
Rx has been sent  

## 2018-05-01 ENCOUNTER — Other Ambulatory Visit: Payer: Self-pay | Admitting: Family Medicine

## 2018-05-01 DIAGNOSIS — Z731 Type A behavior pattern: Secondary | ICD-10-CM

## 2018-05-09 ENCOUNTER — Ambulatory Visit: Payer: Self-pay | Admitting: Family Medicine

## 2018-05-14 ENCOUNTER — Ambulatory Visit: Payer: PRIVATE HEALTH INSURANCE | Admitting: Family Medicine

## 2018-05-14 ENCOUNTER — Encounter: Payer: Self-pay | Admitting: Family Medicine

## 2018-05-14 VITALS — BP 140/80 | HR 81 | Temp 98.1°F | Wt 219.0 lb

## 2018-05-14 DIAGNOSIS — N3944 Nocturnal enuresis: Secondary | ICD-10-CM | POA: Insufficient documentation

## 2018-05-14 DIAGNOSIS — R351 Nocturia: Secondary | ICD-10-CM | POA: Diagnosis not present

## 2018-05-14 DIAGNOSIS — Z731 Type A behavior pattern: Secondary | ICD-10-CM | POA: Diagnosis not present

## 2018-05-14 DIAGNOSIS — Z91030 Bee allergy status: Secondary | ICD-10-CM | POA: Diagnosis not present

## 2018-05-14 MED ORDER — IMIPRAMINE HCL 10 MG PO TABS: 10.0000 mg | ORAL_TABLET | Freq: Every day | ORAL | 1 refills | Status: DC

## 2018-05-14 MED ORDER — EPINEPHRINE 0.3 MG/0.3ML IJ SOAJ: 0.3000 mg | INTRAMUSCULAR | 2 refills | Status: AC | PRN

## 2018-05-14 NOTE — Patient Instructions (Signed)
Trial of taper of zoloft: 1/2 tablet daily x 2 weeks, then 1/2 tablet every other day x 7 doses, then 1/4 tablet every other day x 7 doses then stop.

## 2018-05-14 NOTE — Progress Notes (Signed)
[name] Hagan DOB: [birth year] Encounter date: 05/14/2018  This is a 43 y.o. male who presents with Chief Complaint  Patient presents with  . Follow-up    History of present illness:  No specific concerns or worries today.   Follow up on status of chronic conditions/medications:  Zoloft: Helps with "type A" personality. Not sure how much helping. Has been on this for about 5 years. Denies any history of depression or anxiety.   Tofranil: Has taken this for years. Nocturnal incontinence since childhood. He has followed with urology in the past for this. It is completely controlled with medication but recurs when he stops medication.  Epinephrine is out of date. He had significant reaction to yellow jacket sting in college but has been stung by other bee since without rxn. Uncertain if it is just yellow jacket that he reacts to. Has not been carrying epi pen with him.    Allergies  Allergen Reactions  . Bee Venom   . Penicillins     REACTION: unknown   Current Meds  Medication Sig  . imipramine (TOFRANIL) 10 MG tablet Take 1 tablet (10 mg total) by mouth at bedtime.  . sertraline (ZOLOFT) 100 MG tablet TAKE 1 TABLET BY MOUTH EVERY DAY  . valACYclovir (VALTREX) 1000 MG tablet TAKE 1 TABLET (1,000 MG TOTAL) BY MOUTH 3 (THREE) TIMES DAILY.  . [DISCONTINUED] EPINEPHrine (EPIPEN 2-PAK) 0.3 mg/0.3 mL DEVI Inject 0.3 mLs (0.3 mg total) into the muscle once.  . [DISCONTINUED] imipramine (TOFRANIL) 10 MG tablet Take 1 tablet (10 mg total) by mouth at bedtime.    Review of Systems  Constitutional: Negative for chills, fatigue and fever.  Respiratory: Negative for cough, chest tightness, shortness of breath and wheezing.   Cardiovascular: Negative for chest pain, palpitations and leg swelling.  Genitourinary:       Has stable soft tissue enlargement scrotum. Has been present for years. No change in size. Has been evaluated by urology in past.   States that he was cleared for follow up  by oncology for testicular cancer. Followed with them routinely for years after treatment.     Objective:  BP 140/80 (BP Location: Left Arm, Patient Position: Sitting, Cuff Size: Normal)   Pulse 81   Temp 98.1 F (36.7 C) (Oral)   Wt 219 lb (99.3 kg)   SpO2 96%   BMI 26.66 kg/m   Weight: 219 lb (99.3 kg)   BP Readings from Last 3 Encounters:  05/14/18 140/80  11/06/17 138/80  06/19/16 120/66   Wt Readings from Last 3 Encounters:  05/14/18 219 lb (99.3 kg)  11/06/17 222 lb 4.8 oz (100.8 kg)  06/19/16 216 lb (98 kg)    Physical Exam Constitutional:      General: He is not in acute distress.    Appearance: He is well-developed.  Cardiovascular:     Rate and Rhythm: Normal rate and regular rhythm.     Heart sounds: Normal heart sounds. No murmur. No friction rub.  Pulmonary:     Effort: Pulmonary effort is normal. No respiratory distress.     Breath sounds: Normal breath sounds. No wheezing or rales.  Musculoskeletal:     Right lower leg: No edema.     Left lower leg: No edema.  Neurological:     Mental Status: He is alert and oriented to person, place, and time.  Psychiatric:        Behavior: Behavior normal.     Assessment/Plan  1. Yellow jacket  sting allergy Stressed importance of carrying pen with him when outdoors. Verbalized understanding of severity of response to sting and timeliness of injection. - EPINEPHrine (EPIPEN 2-PAK) 0.3 mg/0.3 mL IJ SOAJ injection; Inject 0.3 mLs (0.3 mg total) into the muscle as needed for anaphylaxis.  Dispense: 1 Device; Refill: 2  2. Nocturia Well controlled with imipramine. I do not feel he needs to see urology just for this medication since his sx are well controlled and he has been stable on this for years.  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 Has interest in tapering off zoloft. We will try this (taper written on patient instructions) and I have encouraged him to contact me if he needs lower  dose for longer taper. He has tried to stop medication in past but didn't feel well when stopping without taper.    Return in about 6 months (around 11/14/2018) for physical exam.     [name], MD

## 2018-06-11 ENCOUNTER — Ambulatory Visit (INDEPENDENT_AMBULATORY_CARE_PROVIDER_SITE_OTHER): Payer: PRIVATE HEALTH INSURANCE | Admitting: Family Medicine

## 2018-06-11 ENCOUNTER — Other Ambulatory Visit: Payer: Self-pay

## 2018-06-11 DIAGNOSIS — R32 Unspecified urinary incontinence: Secondary | ICD-10-CM

## 2018-06-11 DIAGNOSIS — S40862A Insect bite (nonvenomous) of left upper arm, initial encounter: Secondary | ICD-10-CM

## 2018-06-11 DIAGNOSIS — W57XXXA Bitten or stung by nonvenomous insect and other nonvenomous arthropods, initial encounter: Secondary | ICD-10-CM | POA: Diagnosis not present

## 2018-06-11 MED ORDER — IMIPRAMINE HCL 10 MG PO TABS: 10.0000 mg | ORAL_TABLET | Freq: Every day | ORAL | 1 refills | Status: DC

## 2018-06-11 MED ORDER — DOXYCYCLINE HYCLATE 100 MG PO TABS: 100.0000 mg | ORAL_TABLET | Freq: Two times a day (BID) | ORAL | 0 refills | Status: AC

## 2018-06-11 NOTE — Progress Notes (Signed)
Virtual Visit via Video Note  I connected with@ on 06/11/18 at 10:30 AM EDT by a video enabled telemedicine application and verified that I am speaking with the correct person using two identifiers.  Location patient: home Location provider:work or home office Persons participating in the virtual visit: patient, provider  I discussed the limitations of evaluation and management by telemedicine and the availability of in person appointments. The patient expressed understanding and agreed to proceed.   Jose Reyes DOB: 1975-04-09 Encounter date: 06/11/2018  This is a 43 y.o. male who presents with No chief complaint on file.   History of present illness:  Found tick this morning (girlfriend noted it); thinks it has been there at least a couple of days; but not sure when he would have been exposed to have it attach - could have been as long as weekend.   Tick came off, but there were marks left in left axilla and wasn't sure what to do at this point. No other rash or sick symptoms.     Allergies  Allergen Reactions  . Bee Venom   . Penicillins     REACTION: unknown   No outpatient medications have been marked as taking for the 06/11/18 encounter (Office Visit) with Caren Macadam, MD.    Review of Systems  Constitutional: Negative for chills, fatigue and fever.  Musculoskeletal: Negative for arthralgias.  Skin: Positive for wound (fron tick bite; smal red papule). Negative for rash.    Objective:  There were no vitals taken for this visit.      BP Readings from Last 3 Encounters:  05/14/18 140/80  11/06/17 138/80  06/19/16 120/66   Wt Readings from Last 3 Encounters:  05/14/18 219 lb (99.3 kg)  11/06/17 222 lb 4.8 oz (100.8 kg)  06/19/16 216 lb (98 kg)    EXAM:  GENERAL: alert, oriented, appears well and in no acute distress  HEENT: atraumatic, conjunctiva clear, no obvious abnormalities on inspection of external nose and ears  NECK: normal movements of the  head and neck  LUNGS: on inspection no signs of respiratory distress, breathing rate appears normal, no obvious gross SOB, gasping or wheezing  CV: no obvious cyanosis  MS: moves all visible extremities without noticeable abnormality  PSYCH/NEURO: pleasant and cooperative, no obvious depression or anxiety, speech and thought processing grossly intact  SKIN: there are 2 small 0.25cm erythematous papules left axilla. No surrounding rash or irritation appreciated.   Assessment/Plan  1. Tick bite, initial encounter Patient did save tick that he removed from skin and it appeared to be lone star tick. Although risk of transmission of disease is low, due to uncertain time tick attached and low risk with tx will cover with doxy. Discussed new medication(s) today with patient. Discussed potential side effects and patient verbalized understanding.  - doxycycline (VIBRA-TABS) 100 MG tablet; Take 1 tablet (100 mg total) by mouth 2 (two) times daily for 7 days.  Dispense: 14 tablet; Refill: 0  2. Urinary incontinence, unspecified type Refill needed - imipramine (TOFRANIL) 10 MG tablet; Take 1 tablet (10 mg total) by mouth at bedtime.  Dispense: 90 tablet; Refill: 1      I discussed the assessment and treatment plan with the patient. The patient was provided an opportunity to ask questions and all were answered. The patient agreed with the plan and demonstrated an understanding of the instructions.   The patient was advised to call back or seek an in-person evaluation if the symptoms worsen  or if the condition fails to improve as anticipated.  [name], MD

## 2018-10-24 ENCOUNTER — Other Ambulatory Visit: Payer: Self-pay | Admitting: Family Medicine

## 2018-10-24 DIAGNOSIS — Z731 Type A behavior pattern: Secondary | ICD-10-CM

## 2018-11-04 ENCOUNTER — Encounter: Payer: BLUE CROSS/BLUE SHIELD | Admitting: Family Medicine

## 2018-12-31 ENCOUNTER — Other Ambulatory Visit: Payer: Self-pay

## 2018-12-31 ENCOUNTER — Encounter: Payer: Self-pay | Admitting: Family Medicine

## 2018-12-31 ENCOUNTER — Ambulatory Visit (INDEPENDENT_AMBULATORY_CARE_PROVIDER_SITE_OTHER): Payer: PRIVATE HEALTH INSURANCE | Admitting: Family Medicine

## 2018-12-31 VITALS — BP 120/84 | HR 72 | Temp 94.1°F | Ht 76.5 in | Wt 216.3 lb

## 2018-12-31 DIAGNOSIS — Z23 Encounter for immunization: Secondary | ICD-10-CM | POA: Diagnosis not present

## 2018-12-31 DIAGNOSIS — L578 Other skin changes due to chronic exposure to nonionizing radiation: Secondary | ICD-10-CM

## 2018-12-31 DIAGNOSIS — E785 Hyperlipidemia, unspecified: Secondary | ICD-10-CM

## 2018-12-31 DIAGNOSIS — Z0000 Encounter for general adult medical examination without abnormal findings: Secondary | ICD-10-CM

## 2018-12-31 DIAGNOSIS — L309 Dermatitis, unspecified: Secondary | ICD-10-CM

## 2018-12-31 DIAGNOSIS — R739 Hyperglycemia, unspecified: Secondary | ICD-10-CM

## 2018-12-31 DIAGNOSIS — Z731 Type A behavior pattern: Secondary | ICD-10-CM | POA: Diagnosis not present

## 2018-12-31 DIAGNOSIS — N3944 Nocturnal enuresis: Secondary | ICD-10-CM

## 2018-12-31 DIAGNOSIS — R351 Nocturia: Secondary | ICD-10-CM | POA: Diagnosis not present

## 2018-12-31 LAB — COMPREHENSIVE METABOLIC PANEL
ALT: 27 U/L (ref 0–53)
AST: 23 U/L (ref 0–37)
Albumin: 4.5 g/dL (ref 3.5–5.2)
Alkaline Phosphatase: 64 U/L (ref 39–117)
BUN: 16 mg/dL (ref 6–23)
CO2: 30 mEq/L (ref 19–32)
Calcium: 9.5 mg/dL (ref 8.4–10.5)
Chloride: 103 mEq/L (ref 96–112)
Creatinine, Ser: 0.91 mg/dL (ref 0.40–1.50)
GFR: 89.95 mL/min (ref >60.00)
Glucose, Bld: 101 mg/dL — ABNORMAL HIGH (ref 70–99)
Potassium: 4.8 mEq/L (ref 3.5–5.1)
Sodium: 139 mEq/L (ref 135–145)
Total Bilirubin: 0.5 mg/dL (ref 0.2–1.2)
Total Protein: 7.3 g/dL (ref 6.0–8.3)

## 2018-12-31 LAB — CBC WITH DIFFERENTIAL/PLATELET
Basophils Absolute: 0 10*3/uL (ref 0.0–0.1)
Basophils Relative: 0.4 % (ref 0.0–3.0)
Eosinophils Absolute: 0.1 10*3/uL (ref 0.0–0.7)
Eosinophils Relative: 2.1 % (ref 0.0–5.0)
HCT: 45.6 % (ref 39.0–52.0)
Hemoglobin: 15.2 g/dL (ref 13.0–17.0)
Lymphocytes Relative: 26.1 % (ref 12.0–46.0)
Lymphs Abs: 1.4 10*3/uL (ref 0.7–4.0)
MCHC: 33.3 g/dL (ref 30.0–36.0)
MCV: 93.4 fl (ref 78.0–100.0)
Monocytes Absolute: 0.4 10*3/uL (ref 0.1–1.0)
Monocytes Relative: 8.3 % (ref 3.0–12.0)
Neutro Abs: 3.3 10*3/uL (ref 1.4–7.7)
Neutrophils Relative %: 63.1 % (ref 43.0–77.0)
Platelets: 225 10*3/uL (ref 150.0–400.0)
RBC: 4.88 Mil/uL (ref 4.22–5.81)
RDW: 13.2 % (ref 11.5–15.5)
WBC: 5.2 10*3/uL (ref 4.0–10.5)

## 2018-12-31 LAB — LIPID PANEL
Cholesterol: 227 mg/dL — ABNORMAL HIGH (ref 0–200)
HDL: 49.7 mg/dL (ref >39.00)
LDL Cholesterol: 156 mg/dL — ABNORMAL HIGH (ref 0–99)
NonHDL: 177.59
Total CHOL/HDL Ratio: 5
Triglycerides: 108 mg/dL (ref 0.0–149.0)
VLDL: 21.6 mg/dL (ref 0.0–40.0)

## 2018-12-31 LAB — TSH: TSH: 1.26 u[IU]/mL (ref 0.35–4.50)

## 2018-12-31 MED ORDER — SERTRALINE HCL 100 MG PO TABS: 50.0000 mg | ORAL_TABLET | Freq: Every day | ORAL | 0 refills | Status: DC

## 2018-12-31 MED ORDER — TRIAMCINOLONE ACETONIDE 0.5 % EX OINT: 1.0000 "application " | TOPICAL_OINTMENT | Freq: Two times a day (BID) | CUTANEOUS | 0 refills | Status: DC

## 2018-12-31 NOTE — Patient Instructions (Signed)
Ok to break zoloft in half and see how you do with this. Give me update in a couple of weeks.

## 2018-12-31 NOTE — Progress Notes (Addendum)
Jose Reyes DOB: October 30, 1975 Encounter date: 12/31/2018  This is a 43 y.o. male who presents for complete physical   History of present illness/Additional concerns:  Has spot on right lower leg - family was concerned about. Has been present for at least 6 months. Doesn't remember there being anything in that location. Mild itch. No bumps, non tender. He doesn't think it has changed size. Family thinks enlarging. Does not have dermatologist that he sees on regular basis. Hasn't put anything on this.   Doing ok with his other medications. Started on zoloft after recommendation from friend. Not had issues with depression in past; just tried to help with A -type personality. Felt better on medication. Doesn't feel like he needs it at this point.    Past Medical History:  Diagnosis Date  . Reflux   . Testicular cancer ([place]) 1997   treated with surgery x 2, chemo.    Past Surgical History:  Procedure Laterality Date  . ANTERIOR CRUCIATE LIGAMENT REPAIR Right 2005  . 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 testicular Right    cancer  . TOOTH EXTRACTION     wisdom teeth and tooth injury   Allergies  Allergen Reactions  . Bee Venom   . Penicillins     REACTION: unknown   Current Meds  Medication Sig  . EPINEPHrine (EPIPEN 2-PAK) 0.3 mg/0.3 mL IJ SOAJ injection Inject 0.3 mLs (0.3 mg total) into the muscle as needed for anaphylaxis.  Marland Kitchen imipramine (TOFRANIL) 10 MG tablet Take 1 tablet (10 mg total) by mouth at bedtime.  . sertraline (ZOLOFT) 100 MG tablet TAKE 1 TABLET BY MOUTH EVERY DAY  . valACYclovir (VALTREX) 1000 MG tablet TAKE 1 TABLET (1,000 MG TOTAL) BY MOUTH 3 (THREE) TIMES DAILY.   Social History   Tobacco Use  . Smoking status: Never Smoker  . Smokeless tobacco: Never Used  Substance Use Topics  . Alcohol use: Yes    Alcohol/week: 10.0 standard drinks    Types: 10 Cans of beer per week    Comment: occ   Family History  Problem  Relation Age of Onset  . Stomach cancer Father   . Esophageal cancer Father   . Heart attack Paternal Grandfather 71  . CAD Maternal Grandmother   . Cancer Maternal Grandfather   . Melanoma Paternal Grandmother 56     Review of Systems  Constitutional: Negative for activity change, appetite change, chills, fatigue, fever and unexpected weight change.  HENT: Negative for congestion, ear pain, hearing loss, sinus pressure, sinus pain, sore throat and trouble swallowing.   Eyes: Negative for pain and visual disturbance.  Respiratory: Negative for cough, chest tightness, shortness of breath and wheezing.   Cardiovascular: Negative for chest pain, palpitations and leg swelling.  Gastrointestinal: Negative for abdominal distention, abdominal pain, blood in stool, constipation, diarrhea, nausea and vomiting.  Genitourinary: Negative for decreased urine volume, difficulty urinating, dysuria, penile pain and testicular pain.  Musculoskeletal: Negative for arthralgias, back pain and joint swelling.  Skin: Negative for rash.  Neurological: Negative for dizziness, weakness, numbness and headaches.  Hematological: Negative for adenopathy. Does not bruise/bleed easily.  Psychiatric/Behavioral: Negative for agitation, sleep disturbance and suicidal ideas. The patient is not nervous/anxious.     CBC:  Lab Results  Component Value Date   WBC 6.0 02/16/2016   HGB 14.9 02/16/2016   HGB 15.1 11/26/2008   HCT 42.9 02/16/2016   HCT 43.0 11/26/2008   MCH  31.9 11/26/2008   MCHC 34.6 02/16/2016   RDW 13.2 02/16/2016   RDW 12.8 11/26/2008   PLT 217.0 02/16/2016   PLT 177 11/26/2008   CMP: Lab Results  Component Value Date   NA 140 11/07/2017   K 4.5 11/07/2017   CL 103 11/07/2017   CO2 31 11/07/2017   GLUCOSE 100 (H) 11/07/2017   GLUCOSE 102 (H) 02/11/2006   BUN 18 11/07/2017   CREATININE 0.97 11/07/2017   GFRAA 99 02/18/2008   CALCIUM 9.3 11/07/2017   PROT 6.9 11/07/2017   BILITOT 0.6  11/07/2017   ALKPHOS 54 11/07/2017   ALT 24 11/07/2017   AST 23 11/07/2017   LIPID: Lab Results  Component Value Date   CHOL 241 (H) 11/07/2017   TRIG 102.0 11/07/2017   TRIG 60 02/11/2006   HDL 49.70 11/07/2017   LDLCALC 171 (H) 11/07/2017    Objective:  BP 120/84 (BP Location: Right Arm, Patient Position: Sitting, Cuff Size: Large)   Pulse 72   Temp (!) 94.1 F (34.5 C) (Temporal)   Ht 6' 4.5" (1.943 m)   Wt 216 lb 4.8 oz (98.1 kg)   SpO2 98%   BMI 25.99 kg/m   Weight: 216 lb 4.8 oz (98.1 kg)   BP Readings from Last 3 Encounters:  12/31/18 120/84  05/14/18 140/80  11/06/17 138/80   Wt Readings from Last 3 Encounters:  12/31/18 216 lb 4.8 oz (98.1 kg)  05/14/18 219 lb (99.3 kg)  11/06/17 222 lb 4.8 oz (100.8 kg)    Physical Exam Constitutional:      General: He is not in acute distress.    Appearance: He is well-developed.  HENT:     Head: Normocephalic and atraumatic.     Right Ear: External ear normal.     Left Ear: External ear normal.     Nose: Nose normal.     Mouth/Throat:     Pharynx: No oropharyngeal exudate.  Eyes:     Conjunctiva/sclera: Conjunctivae normal.     Pupils: Pupils are equal, round, and reactive to light.  Neck:     Musculoskeletal: Neck supple.     Thyroid: No thyromegaly.  Cardiovascular:     Rate and Rhythm: Normal rate and regular rhythm.     Heart sounds: Normal heart sounds. No murmur. No friction rub. No gallop.   Pulmonary:     Effort: Pulmonary effort is normal. No respiratory distress.     Breath sounds: Normal breath sounds. No stridor. No wheezing or rales.  Abdominal:     General: Bowel sounds are normal.     Palpations: Abdomen is soft.  Musculoskeletal: Normal range of motion.  Skin:    General: Skin is warm and dry.     Comments: Scaling, dry skin with sun damage forehead, ears.   Right lateral lower extremity increased hyperpigemented macule with irregular border - red-brown  Neurological:     Mental  Status: He is alert and oriented to person, place, and time.  Psychiatric:        Behavior: Behavior normal.        Thought Content: Thought content normal.        Judgment: Judgment normal.     Assessment/Plan: There are no preventive care reminders to display for this patient. Health Maintenance reviewed.  1. Preventative health care Keep up with exercise, healthy eating.   2. Type A personality zoloft helps with this, but interested in trying to wean off medication. We will decrease to  50mg  daily and he will update me with how he tolerates this so that I can refill/continue taper appropriately. Patient has not had anxiety or depression in the past or currently.   3. Nocturnal enuresis Stable on the imipramine. This is a condition that he has had since childhood. Has always been well controlled on imipramine.  - CBC with Differential/Platelet; Future  4. Hyperlipidemia, unspecified hyperlipidemia type - Comprehensive metabolic panel; Future - Lipid panel; Future - TSH; Future Slight LDL elevation. Managed with diet/lifestyle. Will continue to monitor. Not requiring medical treatment.  5. Dermatitis Uncertain etiology. Will have him see derm since he should be seeing regularly for skin checks anyway. In meanwhile, trial triamcinolone to see if this will lighten discoloration/help with resolution. Of note, I do not feel that this discoloration requires urgent referral. I am not concerned with this being skin cancer or worrisome lesion. I have removed dermatology referral since this matter is stable after discussion with patient and specialty evaluation is not thought to be necessary, especially during this pandemic.   Return in about 6 months (around 07/01/2019) for Chronic condition visit.  [name], MD  Talked with patient on 04/14/18 and addended note (see bold type above) to give more elaborate explanation of above diagnoses. Patient is overall very healthy. I will  write a specific note addressing each medical diagnosis in more detail as well.

## 2019-01-01 ENCOUNTER — Telehealth: Payer: Self-pay | Admitting: Family Medicine

## 2019-01-01 NOTE — Addendum Note (Signed)
Addended by: Agnes Lawrence on: 01/01/2019 04:35 PM   Modules accepted: Orders

## 2019-01-01 NOTE — Telephone Encounter (Signed)
Patient returning call for results. NT unavailable.      Copied from [place] [id number]. Topic: Quick Communication - Lab Results (Clinic Use ONLY) >> Jan 01, 2019  4:29 PM Agnes Lawrence, CMA wrote: Called patient to inform them of lab results. When patient returns call, triage nurse may disclose results.

## 2019-01-01 NOTE — Telephone Encounter (Signed)
Attempted to call pt to go over his labs with him, no answer, left message that he could call back.

## 2019-01-02 ENCOUNTER — Other Ambulatory Visit (INDEPENDENT_AMBULATORY_CARE_PROVIDER_SITE_OTHER): Payer: PRIVATE HEALTH INSURANCE

## 2019-01-02 DIAGNOSIS — R739 Hyperglycemia, unspecified: Secondary | ICD-10-CM

## 2019-01-02 LAB — HEMOGLOBIN A1C: Hgb A1c MFr Bld: 5.4 % (ref 4.6–6.5)

## 2019-01-08 ENCOUNTER — Encounter: Payer: Self-pay | Admitting: Family Medicine

## 2019-01-09 ENCOUNTER — Encounter: Payer: Self-pay | Admitting: Family Medicine

## 2019-01-20 ENCOUNTER — Other Ambulatory Visit: Payer: Self-pay | Admitting: Family Medicine

## 2019-01-20 DIAGNOSIS — Z731 Type A behavior pattern: Secondary | ICD-10-CM

## 2019-01-23 ENCOUNTER — Encounter: Payer: Self-pay | Admitting: Family Medicine

## 2019-01-23 ENCOUNTER — Telehealth: Payer: Self-pay | Admitting: Family Medicine

## 2019-01-23 NOTE — Telephone Encounter (Signed)
I received the paperwork for health info amendment, but I have a couple of questions. I tried to call [name], but number kept ringing and there was no voicemail. I also don't see her listed in my email or find ability to send her a message through [place], so I am sending back through you.    They documented Jose Reyes as one using depression/anxiety diagnoses, but [name] also documented this. Do they need to touch base with her as well? Just wanted to make sure we were being complete.   Also, am I supposed to sign the form with [name]'s name on it? Just write in that she is no longer here? It looks like a form specifically for her, so I wanted to make sure I was doing this properly. Is a separate letter needed from me since I was not the provider entering those diagnoses?

## 2019-01-30 ENCOUNTER — Telehealth: Payer: Self-pay | Admitting: Family Medicine

## 2019-01-30 NOTE — Telephone Encounter (Signed)
[facility] HIM Dept received a Request for Amendment to his medical chart. I located 2 office notes written by Dr. Noah Charon that contained information patient is requesting to remove. Dr. Noah Charon is no longer with [facility], sent request to practice administrator & Dr. Kim/Dr. Ethlyn Gallery. Dr. Ethlyn Gallery agreed to review the request, & approved it.  Letter of Approval sent to patient. 01/30/19  KLM

## 2019-02-02 ENCOUNTER — Encounter: Payer: Self-pay | Admitting: Family Medicine

## 2019-02-17 ENCOUNTER — Other Ambulatory Visit: Payer: Self-pay | Admitting: Family Medicine

## 2019-02-17 ENCOUNTER — Encounter: Payer: Self-pay | Admitting: Family Medicine

## 2019-02-17 DIAGNOSIS — R32 Unspecified urinary incontinence: Secondary | ICD-10-CM

## 2019-02-17 MED ORDER — IMIPRAMINE HCL 10 MG PO TABS: 10.0000 mg | ORAL_TABLET | Freq: Every day | ORAL | 1 refills | Status: DC

## 2019-02-20 ENCOUNTER — Encounter: Payer: Self-pay | Admitting: Family Medicine

## 2019-02-25 ENCOUNTER — Ambulatory Visit: Payer: HRSA Program | Attending: Internal Medicine

## 2019-02-25 ENCOUNTER — Other Ambulatory Visit: Payer: Self-pay

## 2019-02-25 ENCOUNTER — Encounter: Payer: Self-pay | Admitting: Family Medicine

## 2019-02-25 DIAGNOSIS — Z20828 Contact with and (suspected) exposure to other viral communicable diseases: Secondary | ICD-10-CM | POA: Diagnosis present

## 2019-02-25 DIAGNOSIS — Z20822 Contact with and (suspected) exposure to covid-19: Secondary | ICD-10-CM

## 2019-02-25 DIAGNOSIS — U071 COVID-19: Secondary | ICD-10-CM | POA: Diagnosis not present

## 2019-02-26 NOTE — Progress Notes (Signed)
Moving orders to this encounter.  

## 2019-02-26 NOTE — Progress Notes (Signed)
Order(s) created erroneously. Erroneous order ID: [id number]  Order moved by: Brigitte Pulse  Order move date/time: 02/26/2019 6:34 PM  Source Patient: [id number]  Source Contact: 02/25/2019  Destination Patient: [id number]  Destination Contact: 02/25/2019

## 2019-02-27 ENCOUNTER — Telehealth: Payer: Self-pay | Admitting: *Deleted

## 2019-02-27 ENCOUNTER — Encounter: Payer: Self-pay | Admitting: Family Medicine

## 2019-02-27 LAB — NOVEL CORONAVIRUS, NAA: SARS-CoV-2, NAA: DETECTED — AB

## 2019-02-27 NOTE — Telephone Encounter (Signed)
Routing to correct office for review.

## 2019-02-27 NOTE — Telephone Encounter (Signed)
Patient notified by phone; see other patient advice request message.

## 2019-02-27 NOTE — Telephone Encounter (Deleted)
    Person Under Monitoring Name: Jose Reyes  Location: [place] [address]   CORONAVIRUS DISEASE 2019 (COVID-19) Guidance for Persons Under Investigation You are being tested for the virus that causes coronavirus disease 2019 (COVID-19). Public health actions are necessary to ensure protection of your health and the health of others, and to prevent further spread of infection. COVID-19 is caused by a virus that can cause symptoms, such as fever, cough, and shortness of breath. The primary transmission from person to person is by coughing or sneezing. On April 10, 2018, the [place] announced a [employer] Emergency of International Concern and on April 11, 2018 the U.S. Department of Health and Human Services declared a public health emergency. If the virus that causesCOVID-19 spreads in the community, it could have severe public health consequences.  As a person under investigation for COVID-19, the [place] advises you to adhere to the following guidance until your test results are reported to you. If your test result is positive, you will receive additional information from your provider and your local health department at that time.   Remain at home until you are cleared by your health provider or public health authorities.   Keep a log of visitors to your home using the form provided. Any visitors to your home must be aware of your isolation status.  If you plan to move to a new address or leave the county, notify the local health department in your county.  Call a doctor or seek care if you have an urgent medical need. Before seeking medical care, call ahead and get instructions from the provider before arriving at the medical office, clinic or hospital. Notify them that you are being tested for the virus that causes COVID-19 so arrangements can be made, as necessary, to  prevent transmission to others in the healthcare setting. Next, notify the local health department in your county.  If a medical emergency arises and you need to call 911, inform the first responders that you are being tested for the virus that causes COVID-19. Next, notify the local health department in your county.  Adhere to all guidance set forth by the [place] for [facility] of patients that is based on guidance from the Center for Disease Control and Prevention with suspected or confirmed COVID-19. It is provided with this guidance for Persons Under Investigation.  Your health and the health of our community are our top priorities. Public Health officials remain available to provide assistance and counseling to you about COVID-19 and compliance with this guidance.  Provider: ____________________________________________________________ Date: ______/_____/_________  By signing below, you acknowledge that you have read and agree to comply with this Guidance for Persons Under Investigation. ______________________________________________________________ Date: ______/_____/_________  WHO DO I CALL? You can find a list of local health departments here: [email or web address] Health Department: ____________________________________________________________________ Contact Name: ________________________________________________________________________ Telephone: ___________________________________________________________________________  Marice Potter, [place], Communicable Disease Branch COVID-19 Guidance for Persons Under Investigation May 17, 2018

## 2019-02-27 NOTE — Telephone Encounter (Signed)
Pt called in regards to having questions about being in quarantine. He tested positive last week and have been in quarantine since [id number] th)  and can come out of quarantine on the 25 th. He wanted to know if his partner test positive tomorrow and she lives with him, would he be able to visit his daughter at Christmas. His symptoms have decreased or gone. Advised him to talk with his provider regarding being in quarantine.

## 2019-02-27 NOTE — Telephone Encounter (Signed)
Called pt to go over lab results but no answer. Lm for pt to return call.

## 2019-03-03 ENCOUNTER — Encounter: Payer: Self-pay | Admitting: Family Medicine

## 2019-03-05 ENCOUNTER — Encounter: Payer: Self-pay | Admitting: Family Medicine

## 2019-03-05 ENCOUNTER — Other Ambulatory Visit: Payer: Self-pay | Admitting: Family Medicine

## 2019-03-05 MED ORDER — VALACYCLOVIR HCL 1 G PO TABS: 1000.0000 mg | ORAL_TABLET | Freq: Three times a day (TID) | ORAL | 0 refills | Status: DC

## 2019-03-05 NOTE — Telephone Encounter (Signed)
Copied from [place] [id number]. Topic: Quick Communication - Rx Refill/Question >> Mar 05, 2019  3:35 PM Mcneil, Ja-Kwan wrote: Medication: valACYclovir (VALTREX) 1000 MG tablet  Has the patient contacted their pharmacy? yes   Preferred Pharmacy (with phone number or street name[id number] [place] [place] [facility] [address] Phone: [phone]  Fax: [phone]  Agent: Please be advised that RX refills may take up to 3 business days. We ask that you follow-up with your pharmacy.

## 2019-03-25 ENCOUNTER — Encounter: Payer: Self-pay | Admitting: Family Medicine

## 2019-04-06 ENCOUNTER — Encounter: Payer: Self-pay | Admitting: Family Medicine

## 2019-04-06 ENCOUNTER — Other Ambulatory Visit: Payer: Self-pay | Admitting: Family Medicine

## 2019-04-06 MED ORDER — ACYCLOVIR 400 MG PO TABS: 400.0000 mg | ORAL_TABLET | Freq: Every day | ORAL | 2 refills | Status: DC

## 2019-04-13 ENCOUNTER — Encounter: Payer: Self-pay | Admitting: Family Medicine

## 2019-04-14 NOTE — Telephone Encounter (Signed)
I called the pt and offered to take a message or help if there was an urgent need for today.  Patient stated he would like for Dr Ethlyn Gallery to call him tomorrow when she returns to the office in regards to his insurance.  Message sent to PCP.

## 2019-04-15 ENCOUNTER — Encounter: Payer: Self-pay | Admitting: Family Medicine

## 2019-04-15 NOTE — Addendum Note (Signed)
Addended by: Caren Macadam on: 04/15/2019 06:08 PM   Modules accepted: Orders

## 2019-04-17 ENCOUNTER — Encounter: Payer: Self-pay | Admitting: Family Medicine

## 2019-04-17 ENCOUNTER — Other Ambulatory Visit: Payer: Self-pay | Admitting: Family Medicine

## 2019-04-17 DIAGNOSIS — Z731 Type A behavior pattern: Secondary | ICD-10-CM

## 2019-05-09 ENCOUNTER — Encounter: Payer: Self-pay | Admitting: Family Medicine

## 2019-05-09 DIAGNOSIS — Z731 Type A behavior pattern: Secondary | ICD-10-CM

## 2019-05-11 MED ORDER — SERTRALINE HCL 100 MG PO TABS: 100.0000 mg | ORAL_TABLET | Freq: Every day | ORAL | 1 refills | Status: DC

## 2019-10-08 ENCOUNTER — Encounter: Payer: Self-pay | Admitting: Family Medicine

## 2019-10-19 NOTE — Telephone Encounter (Signed)
Called patient and discussed concerns with Covid vaccine.  Encourage patient to get vaccine for own health benefit.

## 2019-12-01 ENCOUNTER — Telehealth: Payer: Self-pay | Admitting: Family Medicine

## 2019-12-01 NOTE — Telephone Encounter (Signed)
Spoke with the pt and informed him an appt is needed for evaluation.  Appt scheduled with Dr Jerilee Hoh for tomorrow as PCP does not have any openings.

## 2019-12-01 NOTE — Telephone Encounter (Signed)
The patient called stated that his toe may be infected want an antibiotic called into the pharmacy.   I offered to make an office visit; the patient wanted to speak with the nurse or Dr first.

## 2019-12-02 ENCOUNTER — Telehealth (INDEPENDENT_AMBULATORY_CARE_PROVIDER_SITE_OTHER): Payer: PRIVATE HEALTH INSURANCE | Admitting: Internal Medicine

## 2019-12-02 DIAGNOSIS — L03032 Cellulitis of left toe: Secondary | ICD-10-CM

## 2019-12-02 DIAGNOSIS — L600 Ingrowing nail: Secondary | ICD-10-CM

## 2019-12-02 MED ORDER — DOXYCYCLINE HYCLATE 100 MG PO TABS: 100.0000 mg | ORAL_TABLET | Freq: Two times a day (BID) | ORAL | 0 refills | Status: AC

## 2019-12-02 NOTE — Progress Notes (Signed)
Virtual Visit via Video Note  I connected with Jose Reyes on 12/02/19 at  8:00 AM EDT by a video enabled telemedicine application and verified that I am speaking with the correct person using two identifiers.  Location patient: home Location provider: work office Persons participating in the virtual visit: patient, provider  I discussed the limitations of evaluation and management by telemedicine and the availability of in person appointments. The patient expressed understanding and agreed to proceed.   HPI: For the past week Jose has had pain of his left great toe.  He states it has gotten red and swollen.  He has some blood and pus oozing out of the lateral side of his left big toe, he believes the toenail might be ingrown.  He is not a diabetic.   ROS: Constitutional: Denies fever, chills, diaphoresis, appetite change and fatigue.  HEENT: Denies photophobia, eye pain, redness, hearing loss, ear pain, congestion, sore throat, rhinorrhea, sneezing, mouth sores, trouble swallowing, neck pain, neck stiffness and tinnitus.   Respiratory: Denies SOB, DOE, cough, chest tightness,  and wheezing.   Cardiovascular: Denies chest pain, palpitations and leg swelling.  Gastrointestinal: Denies nausea, vomiting, abdominal pain, diarrhea, constipation, blood in stool and abdominal distention.  Genitourinary: Denies dysuria, urgency, frequency, hematuria, flank pain and difficulty urinating.  Endocrine: Denies: hot or cold intolerance, sweats, changes in hair or nails, polyuria, polydipsia. Musculoskeletal: Denies myalgias, back pain, joint swelling, arthralgias and gait problem.  Skin: Denies pallor, rash and wound.  Neurological: Denies dizziness, seizures, syncope, weakness, light-headedness, numbness and headaches.  Hematological: Denies adenopathy. Easy bruising, personal or family bleeding history  Psychiatric/Behavioral: Denies suicidal ideation, mood changes, confusion, nervousness,  sleep disturbance and agitation   Past Medical History:  Diagnosis Date  . Reflux   . Testicular cancer ([place]) 1997   treated with surgery x 2, chemo.     Past Surgical History:  Procedure Laterality Date  . ANTERIOR CRUCIATE LIGAMENT REPAIR Right 2005  . 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 testicular Right 1997   cancer  . TOOTH EXTRACTION     wisdom teeth and tooth injury    Family History  Problem Relation Age of Onset  . Stomach cancer Father   . Esophageal cancer Father   . Heart attack Paternal Grandfather 73  . CAD Maternal Grandmother   . Cancer Maternal Grandfather   . Melanoma Paternal Grandmother 72    SOCIAL HX:   reports that he has never smoked. He has never used smokeless tobacco. He reports current alcohol use of about 10.0 standard drinks of alcohol per week. He reports that he does not use drugs.   Current Outpatient Medications:  .  acyclovir (ZOVIRAX) 400 MG tablet, Take 1 tablet (400 mg total) by mouth 5 (five) times daily. Use for 5 days after onset of symptoms, Disp: 25 tablet, Rfl: 2 .  doxycycline (VIBRA-TABS) 100 MG tablet, Take 1 tablet (100 mg total) by mouth 2 (two) times daily for 7 days., Disp: 14 tablet, Rfl: 0 .  EPINEPHrine (EPIPEN 2-PAK) 0.3 mg/0.3 mL IJ SOAJ injection, Inject 0.3 mLs (0.3 mg total) into the muscle as needed for anaphylaxis., Disp: 1 Device, Rfl: 2 .  imipramine (TOFRANIL) 10 MG tablet, Take 1 tablet (10 mg total) by mouth at bedtime., Disp: 90 tablet, Rfl: 1 .  sertraline (ZOLOFT) 100 MG tablet, Take 1 tablet (100 mg total) by mouth daily., Disp: 90 tablet, Rfl: 1 .  triamcinolone ointment (KENALOG) 0.5 %, Apply 1 application topically 2 (two) times daily., Disp: 30 g, Rfl: 0 .  valACYclovir (VALTREX) 1000 MG tablet, Take 1 tablet (1,000 mg total) by mouth 3 (three) times daily., Disp: 21 tablet, Rfl: 0  EXAM:   VITALS per patient if applicable: None reported  GENERAL: alert, oriented,  appears well and in no acute distress  HEENT: atraumatic, conjunttiva clear, no obvious abnormalities on inspection of external nose and ears  NECK: normal movements of the head and neck  LUNGS: on inspection no signs of respiratory distress, breathing rate appears normal, no obvious gross increased work of breathing, gasping or wheezing  CV: no obvious cyanosis  MS: Limited visibility via video device, however left great toe has some erythema and edema to the medial side with some blood and pus oozing from the toenail.  PSYCH/NEURO: pleasant and cooperative, no obvious depression or anxiety, speech and thought processing grossly intact  ASSESSMENT AND PLAN:   Paronychia of great toe of left foot  Ingrown left big toenail  -He does appear to have a paronychia, I will give him doxycycline, advised Epson salt soaks, and referral to podiatry for what appears to be an ingrown toenail. -He knows to follow-up with [place] in around 10 to 14 days if no improvement.     I discussed the assessment and treatment plan with the patient. The patient was provided an opportunity to ask questions and all were answered. The patient agreed with the plan and demonstrated an understanding of the instructions.   The patient was advised to call back or seek an in-person evaluation if the symptoms worsen or if the condition fails to improve as anticipated.    Lelon Frohlich, MD  [facility]

## 2019-12-03 ENCOUNTER — Ambulatory Visit (INDEPENDENT_AMBULATORY_CARE_PROVIDER_SITE_OTHER): Payer: 59 | Admitting: Podiatry

## 2019-12-03 ENCOUNTER — Encounter: Payer: Self-pay | Admitting: Podiatry

## 2019-12-03 ENCOUNTER — Other Ambulatory Visit: Payer: Self-pay

## 2019-12-03 ENCOUNTER — Encounter: Payer: Self-pay | Admitting: Internal Medicine

## 2019-12-03 DIAGNOSIS — L600 Ingrowing nail: Secondary | ICD-10-CM | POA: Diagnosis not present

## 2019-12-03 MED ORDER — DOXYCYCLINE HYCLATE 100 MG PO TABS: 100.0000 mg | ORAL_TABLET | Freq: Two times a day (BID) | ORAL | 0 refills | Status: AC

## 2019-12-03 NOTE — Progress Notes (Signed)
Subjective:  Patient ID: Jose Reyes, male    DOB: February 26, 1976,  MRN: [id number]  Chief Complaint  Patient presents with  . Ingrown Toenail    left great toe- pt cuts nails and he seems to have cut nail too short- causing naiil to grow inwards- further evlauton     44 y.o. male presents with the above complaint.  Patient presents with left lateral ingrown nail border that has been causing him a lot of pain.  Patient states that he would like to get evaluated and removed.  He has not seen anyone else prior to seeing me.  He states there is some redness associated with it.  He denies any other acute complaints.   Review of Systems: Negative except as noted in the HPI. Denies N/V/F/Ch.  Past Medical History:  Diagnosis Date  . Reflux   . Testicular cancer ([place]) 1997   treated with surgery x 2, chemo.     Current Outpatient Medications:  .  azithromycin (ZITHROMAX) 250 MG tablet, Take 2 tablets (500mg ) today then 1 tablet (250mg ) on days 2-5, Disp: , Rfl:  .  acyclovir (ZOVIRAX) 400 MG tablet, Take 1 tablet (400 mg total) by mouth 5 (five) times daily. Use for 5 days after onset of symptoms, Disp: 25 tablet, Rfl: 2 .  doxycycline (VIBRA-TABS) 100 MG tablet, Take 1 tablet (100 mg total) by mouth 2 (two) times daily for 7 days., Disp: 14 tablet, Rfl: 0 .  doxycycline (VIBRA-TABS) 100 MG tablet, Take 1 tablet (100 mg total) by mouth 2 (two) times daily for 10 days., Disp: 20 tablet, Rfl: 0 .  EPINEPHrine (EPIPEN 2-PAK) 0.3 mg/0.3 mL IJ SOAJ injection, Inject 0.3 mLs (0.3 mg total) into the muscle as needed for anaphylaxis., Disp: 1 Device, Rfl: 2 .  imipramine (TOFRANIL) 10 MG tablet, Take 1 tablet (10 mg total) by mouth at bedtime., Disp: 90 tablet, Rfl: 1 .  sertraline (ZOLOFT) 100 MG tablet, Take 1 tablet (100 mg total) by mouth daily., Disp: 90 tablet, Rfl: 1 .  triamcinolone ointment (KENALOG) 0.5 %, Apply 1 application topically 2 (two) times daily., Disp: 30 g, Rfl: 0 .  valACYclovir  (VALTREX) 1000 MG tablet, Take 1 tablet (1,000 mg total) by mouth 3 (three) times daily., Disp: 21 tablet, Rfl: 0  Social History   Tobacco Use  Smoking Status Never Smoker  Smokeless Tobacco Never Used    Allergies  Allergen Reactions  . Penicillins Hives    REACTION: unknown REACTION: unknown   . Bee Venom    Objective:  There were no vitals filed for this visit. There is no height or weight on file to calculate BMI. Constitutional Well developed. Well nourished.  Vascular Dorsalis pedis pulses palpable bilaterally. Posterior tibial pulses palpable bilaterally. Capillary refill normal to all digits.  No cyanosis or clubbing noted. Pedal hair growth normal.  Neurologic Normal speech. Oriented to person, place, and time. Epicritic sensation to light touch grossly present bilaterally.  Dermatologic Painful ingrowing nail at lateral nail borders of the hallux nail left. No other open wounds. No skin lesions.  Orthopedic: Normal joint ROM without pain or crepitus bilaterally. No visible deformities. No bony tenderness.   Radiographs: None Assessment:   1. Ingrown left big toenail    Plan:  Patient was evaluated and treated and all questions answered.  Ingrown Nail, left -Patient elects to proceed with minor surgery to remove ingrown toenail removal today. Consent reviewed and signed by patient. -Ingrown nail excised. See  procedure note. -Educated on post-procedure care including soaking. Written instructions provided and reviewed. -Patient to follow up in 2 weeks for nail check.  Procedure: Excision of Ingrown Toenail Location: Left 1st toe lateral nail borders. Anesthesia: Lidocaine 1% plain; 1.5 mL and Marcaine 0.5% plain; 1.5 mL, digital block. Skin Prep: Betadine. Dressing: Silvadene; telfa; dry, sterile, compression dressing. Technique: Following skin prep, the toe was exsanguinated and a tourniquet was secured at the base of the toe. The affected nail border  was freed, split with a nail splitter, and excised. Chemical matrixectomy was then performed with phenol and irrigated out with alcohol. The tourniquet was then removed and sterile dressing applied. Disposition: Patient tolerated procedure well. Patient to return in 2 weeks for follow-up.   No follow-ups on file.

## 2020-02-03 ENCOUNTER — Other Ambulatory Visit: Payer: Self-pay | Admitting: Family Medicine

## 2020-02-03 DIAGNOSIS — R32 Unspecified urinary incontinence: Secondary | ICD-10-CM

## 2020-02-05 ENCOUNTER — Other Ambulatory Visit: Payer: Self-pay | Admitting: Family Medicine

## 2020-02-05 DIAGNOSIS — Z731 Type A behavior pattern: Secondary | ICD-10-CM

## 2020-03-09 ENCOUNTER — Encounter: Payer: Self-pay | Admitting: Family Medicine

## 2020-03-10 NOTE — Telephone Encounter (Signed)
Talked with patient today. Interested if increase in zoloft might help with quicker temper. We discussed further evaluation/treatment options and elected to try slight increase in dose to 150mg  daily. Suggest follow up visit to discuss from there.

## 2020-04-19 ENCOUNTER — Encounter: Payer: Self-pay | Admitting: Family Medicine

## 2020-04-19 DIAGNOSIS — Z731 Type A behavior pattern: Secondary | ICD-10-CM

## 2020-04-20 MED ORDER — SERTRALINE HCL 100 MG PO TABS: 150.0000 mg | ORAL_TABLET | Freq: Every day | ORAL | 0 refills | Status: DC

## 2020-05-04 ENCOUNTER — Other Ambulatory Visit: Payer: Self-pay | Admitting: Family Medicine

## 2020-05-04 DIAGNOSIS — Z731 Type A behavior pattern: Secondary | ICD-10-CM

## 2020-05-04 DIAGNOSIS — R32 Unspecified urinary incontinence: Secondary | ICD-10-CM

## 2020-05-06 ENCOUNTER — Other Ambulatory Visit: Payer: Self-pay

## 2020-05-09 ENCOUNTER — Other Ambulatory Visit: Payer: Self-pay

## 2020-05-09 ENCOUNTER — Ambulatory Visit (INDEPENDENT_AMBULATORY_CARE_PROVIDER_SITE_OTHER): Payer: 59 | Admitting: Family Medicine

## 2020-05-09 VITALS — BP 138/88 | HR 72 | Temp 97.6°F | Ht 76.5 in | Wt 221.0 lb

## 2020-05-09 DIAGNOSIS — R030 Elevated blood-pressure reading, without diagnosis of hypertension: Secondary | ICD-10-CM

## 2020-05-09 DIAGNOSIS — Z13220 Encounter for screening for lipoid disorders: Secondary | ICD-10-CM | POA: Diagnosis not present

## 2020-05-09 DIAGNOSIS — L570 Actinic keratosis: Secondary | ICD-10-CM | POA: Diagnosis not present

## 2020-05-09 DIAGNOSIS — Z419 Encounter for procedure for purposes other than remedying health state, unspecified: Secondary | ICD-10-CM

## 2020-05-09 DIAGNOSIS — Z131 Encounter for screening for diabetes mellitus: Secondary | ICD-10-CM

## 2020-05-09 DIAGNOSIS — Z0000 Encounter for general adult medical examination without abnormal findings: Secondary | ICD-10-CM

## 2020-05-09 DIAGNOSIS — N3944 Nocturnal enuresis: Secondary | ICD-10-CM

## 2020-05-09 DIAGNOSIS — Z731 Type A behavior pattern: Secondary | ICD-10-CM

## 2020-05-09 LAB — CBC WITH DIFFERENTIAL/PLATELET
Basophils Absolute: 0 10*3/uL (ref 0.0–0.1)
Basophils Relative: 0.5 % (ref 0.0–3.0)
Eosinophils Absolute: 0.1 10*3/uL (ref 0.0–0.7)
Eosinophils Relative: 1.1 % (ref 0.0–5.0)
HCT: 44.1 % (ref 39.0–52.0)
Hemoglobin: 15.2 g/dL (ref 13.0–17.0)
Lymphocytes Relative: 20 % (ref 12.0–46.0)
Lymphs Abs: 1.3 10*3/uL (ref 0.7–4.0)
MCHC: 34.6 g/dL (ref 30.0–36.0)
MCV: 91 fl (ref 78.0–100.0)
Monocytes Absolute: 0.5 10*3/uL (ref 0.1–1.0)
Monocytes Relative: 7.8 % (ref 3.0–12.0)
Neutro Abs: 4.8 10*3/uL (ref 1.4–7.7)
Neutrophils Relative %: 70.6 % (ref 43.0–77.0)
Platelets: 212 10*3/uL (ref 150.0–400.0)
RBC: 4.84 Mil/uL (ref 4.22–5.81)
RDW: 12.9 % (ref 11.5–15.5)
WBC: 6.7 10*3/uL (ref 4.0–10.5)

## 2020-05-09 LAB — URINALYSIS
Bilirubin Urine: NEGATIVE
Hgb urine dipstick: NEGATIVE
Ketones, ur: NEGATIVE
Leukocytes,Ua: NEGATIVE
Nitrite: NEGATIVE
Specific Gravity, Urine: 1.01 (ref 1.000–1.030)
Total Protein, Urine: NEGATIVE
Urine Glucose: NEGATIVE
Urobilinogen, UA: 0.2 (ref 0.0–1.0)
pH: 7.5 (ref 5.0–8.0)

## 2020-05-09 LAB — COMPREHENSIVE METABOLIC PANEL
ALT: 32 U/L (ref 0–53)
AST: 26 U/L (ref 0–37)
Albumin: 4.6 g/dL (ref 3.5–5.2)
Alkaline Phosphatase: 59 U/L (ref 39–117)
BUN: 17 mg/dL (ref 6–23)
CO2: 28 mEq/L (ref 19–32)
Calcium: 9.9 mg/dL (ref 8.4–10.5)
Chloride: 102 mEq/L (ref 96–112)
Creatinine, Ser: 0.9 mg/dL (ref 0.40–1.50)
GFR: 102.27 mL/min (ref >60.00)
Glucose, Bld: 98 mg/dL (ref 70–99)
Potassium: 4.5 mEq/L (ref 3.5–5.1)
Sodium: 138 mEq/L (ref 135–145)
Total Bilirubin: 0.5 mg/dL (ref 0.2–1.2)
Total Protein: 7.6 g/dL (ref 6.0–8.3)

## 2020-05-09 LAB — PROTIME-INR
INR: 1 ratio (ref 0.8–1.0)
Prothrombin Time: 11.7 s (ref 9.6–13.1)

## 2020-05-09 LAB — LIPID PANEL
Cholesterol: 281 mg/dL — ABNORMAL HIGH (ref 0–200)
HDL: 57.8 mg/dL (ref >39.00)
LDL Cholesterol: 185 mg/dL — ABNORMAL HIGH (ref 0–99)
NonHDL: 223.16
Total CHOL/HDL Ratio: 5
Triglycerides: 189 mg/dL — ABNORMAL HIGH (ref 0.0–149.0)
VLDL: 37.8 mg/dL (ref 0.0–40.0)

## 2020-05-09 LAB — HEMOGLOBIN A1C: Hgb A1c MFr Bld: 5.4 % (ref 4.6–6.5)

## 2020-05-09 LAB — TSH: TSH: 1.73 u[IU]/mL (ref 0.35–4.50)

## 2020-05-09 MED ORDER — SERTRALINE HCL 100 MG PO TABS: 150.0000 mg | ORAL_TABLET | Freq: Every day | ORAL | 3 refills | Status: DC

## 2020-05-09 NOTE — Patient Instructions (Signed)
Monitor blood pressures at home - let me know if you are seeing numbers 130/85+ consistently.   [email or web address]">  DASH Eating Plan DASH stands for Dietary Approaches to Stop Hypertension. The DASH eating plan is a healthy eating plan that has been shown to:  Reduce high blood pressure (hypertension).  Reduce your risk for type 2 diabetes, heart disease, and stroke.  Help with weight loss. What are tips for following this plan? Reading food labels  Check food labels for the amount of salt (sodium) per serving. Choose foods with less than 5 percent of the Daily Value of sodium. Generally, foods with less than 300 milligrams (mg) of sodium per serving fit into this eating plan.  To find whole grains, look for the word "whole" as the first word in the ingredient list. Shopping  Buy products labeled as "low-sodium" or "no salt added."  Buy fresh foods. Avoid canned foods and pre-made or frozen meals. Cooking  Avoid adding salt when cooking. Use salt-free seasonings or herbs instead of table salt or sea salt. Check with your health care provider or pharmacist before using salt substitutes.  Do not fry foods. Cook foods using healthy methods such as baking, boiling, grilling, roasting, and broiling instead.  Cook with heart-healthy oils, such as olive, canola, avocado, soybean, or sunflower oil. Meal planning  Eat a balanced diet that includes: ? 4 or more servings of fruits and 4 or more servings of vegetables each day. Try to fill one-half of your plate with fruits and vegetables. ? 6-8 servings of whole grains each day. ? Less than 6 oz (170 g) of lean meat, poultry, or fish each day. A 3-oz (85-g) serving of meat is about the same size as a deck of cards. One egg equals 1 oz (28 g). ? 2-3 servings of low-fat dairy each day. One serving is 1 cup (237 mL). ? 1 serving of nuts, seeds, or beans 5 times each week. ? 2-3 servings of  heart-healthy fats. Healthy fats called omega-3 fatty acids are found in foods such as walnuts, flaxseeds, fortified milks, and eggs. These fats are also found in cold-water fish, such as sardines, salmon, and mackerel.  Limit how much you eat of: ? Canned or prepackaged foods. ? Food that is high in trans fat, such as some fried foods. ? Food that is high in saturated fat, such as fatty meat. ? Desserts and other sweets, sugary drinks, and other foods with added sugar. ? Full-fat dairy products.  Do not salt foods before eating.  Do not eat more than 4 egg yolks a week.  Try to eat at least 2 vegetarian meals a week.  Eat more home-cooked food and less restaurant, buffet, and fast food.   Lifestyle  When eating at a restaurant, ask that your food be prepared with less salt or no salt, if possible.  If you drink alcohol: ? Limit how much you use to:  0-1 drink a day for women who are not pregnant.  0-2 drinks a day for men. ? Be aware of how much alcohol is in your drink. In the [place] one drink equals one 12 oz bottle of beer (355 mL), one 5 oz glass of wine (148 mL), or one 1 oz glass of hard liquor (44 mL). General information  Avoid eating more than 2,300 mg of salt a day. If you have hypertension, you may need to reduce your sodium intake to 1,500 mg a day.  Work with your health care  provider to maintain a healthy body weight or to lose weight. Ask what an ideal weight is for you.  Get at least 30 minutes of exercise that causes your heart to beat faster (aerobic exercise) most days of the week. Activities may include walking, swimming, or biking.  Work with your health care provider or dietitian to adjust your eating plan to your individual calorie needs. What foods should I eat? Fruits All fresh, dried, or frozen fruit. Canned fruit in natural juice (without added sugar). Vegetables Fresh or frozen vegetables (raw, steamed, roasted, or grilled). Low-sodium or  reduced-sodium tomato and vegetable juice. Low-sodium or reduced-sodium tomato sauce and tomato paste. Low-sodium or reduced-sodium canned vegetables. Grains Whole-grain or whole-wheat bread. Whole-grain or whole-wheat pasta. Brown rice. [place]. Bulgur. Whole-grain and low-sodium cereals. Pita bread. Low-fat, low-sodium crackers. Whole-wheat flour tortillas. Meats and other proteins Skinless chicken or [place]. Ground chicken or [place]. Pork with fat trimmed off. Fish and seafood. Egg whites. Dried beans, peas, or lentils. Unsalted nuts, nut butters, and seeds. Unsalted canned beans. Lean cuts of beef with fat trimmed off. Low-sodium, lean precooked or cured meat, such as sausages or meat loaves. Dairy Low-fat (1%) or fat-free (skim) milk. Reduced-fat, low-fat, or fat-free cheeses. Nonfat, low-sodium ricotta or cottage cheese. Low-fat or nonfat yogurt. Low-fat, low-sodium cheese. Fats and oils Soft margarine without trans fats. Vegetable oil. Reduced-fat, low-fat, or light mayonnaise and salad dressings (reduced-sodium). Canola, safflower, olive, avocado, soybean, and sunflower oils. Avocado. Seasonings and condiments Herbs. Spices. Seasoning mixes without salt. Other foods Unsalted popcorn and pretzels. Fat-free sweets. The items listed above may not be a complete list of foods and beverages you can eat. Contact a dietitian for more information. What foods should I avoid? Fruits Canned fruit in a light or heavy syrup. Fried fruit. Fruit in cream or butter sauce. Vegetables Creamed or fried vegetables. Vegetables in a cheese sauce. Regular canned vegetables (not low-sodium or reduced-sodium). Regular canned tomato sauce and paste (not low-sodium or reduced-sodium). Regular tomato and vegetable juice (not low-sodium or reduced-sodium). Angie Fava. Olives. Grains Baked goods made with fat, such as croissants, muffins, or some breads. Dry pasta or rice meal packs. Meats and other  proteins Fatty cuts of meat. Ribs. Fried meat. Berniece Salines. Bologna, salami, and other precooked or cured meats, such as sausages or meat loaves. Fat from the back of a pig (fatback). Bratwurst. Salted nuts and seeds. Canned beans with added salt. Canned or smoked fish. Whole eggs or egg yolks. Chicken or [place] with skin. Dairy Whole or 2% milk, cream, and half-and-half. Whole or full-fat cream cheese. Whole-fat or sweetened yogurt. Full-fat cheese. Nondairy creamers. Whipped toppings. Processed cheese and cheese spreads. Fats and oils Butter. Stick margarine. Lard. Shortening. Ghee. Bacon fat. Tropical oils, such as coconut, palm kernel, or palm oil. Seasonings and condiments Onion salt, garlic salt, seasoned salt, table salt, and sea salt. Worcestershire sauce. Tartar sauce. Barbecue sauce. Teriyaki sauce. Soy sauce, including reduced-sodium. Steak sauce. Canned and packaged gravies. Fish sauce. Oyster sauce. Cocktail sauce. Store-bought horseradish. Ketchup. Mustard. Meat flavorings and tenderizers. Bouillon cubes. Hot sauces. Pre-made or packaged marinades. Pre-made or packaged taco seasonings. Relishes. Regular salad dressings. Other foods Salted popcorn and pretzels. The items listed above may not be a complete list of foods and beverages you should avoid. Contact a dietitian for more information. Where to find more information  National Heart, Lung, and Blood Institute: https://wilson-eaton.com/  American Heart Association: www.heart.org  Academy of Nutrition and Dietetics: www.eatright.org  National Kidney  Foundation: www.kidney.org Summary  The DASH eating plan is a healthy eating plan that has been shown to reduce high blood pressure (hypertension). It may also reduce your risk for type 2 diabetes, heart disease, and stroke.  When on the DASH eating plan, aim to eat more fresh fruits and vegetables, whole grains, lean proteins, low-fat dairy, and heart-healthy fats.  With the DASH eating plan,  you should limit salt (sodium) intake to 2,300 mg a day. If you have hypertension, you may need to reduce your sodium intake to 1,500 mg a day.  Work with your health care provider or dietitian to adjust your eating plan to your individual calorie needs. This information is not intended to replace advice given to you by your health care provider. Make sure you discuss any questions you have with your health care provider. Document Revised: 01/30/2019 Document Reviewed: 01/30/2019 Elsevier Patient Education  2021 [employer].

## 2020-05-09 NOTE — Progress Notes (Signed)
Jose Reyes DOB: Aug 14, 1975 Encounter date: 05/09/2020  This is a 45 y.o. male who presents for complete physical and preoperative assessment  History of present illness/Additional concerns:  Planning for total knee next Monday - left knee. Has been locking; bothering him. Has been issue for years, but is limiting for him- has cut out certain activities like bike riding.   Taking the sertraline 150mg  daily and feels this dose has been helpful for him.   Past Medical History:  Diagnosis Date  . Reflux   . Testicular cancer ([place]) 1997   treated with surgery x 2, chemo.    Past Surgical History:  Procedure Laterality Date  . ANTERIOR CRUCIATE LIGAMENT REPAIR Right 2005  . 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 testicular Right 1997   cancer  . TOOTH EXTRACTION     wisdom teeth and tooth injury   Allergies  Allergen Reactions  . Penicillins Hives    REACTION: unknown REACTION: unknown   . Bee Venom    Current Meds  Medication Sig  . acyclovir (ZOVIRAX) 400 MG tablet Take 1 tablet (400 mg total) by mouth 5 (five) times daily. Use for 5 days after onset of symptoms  . EPINEPHrine (EPIPEN 2-PAK) 0.3 mg/0.3 mL IJ SOAJ injection Inject 0.3 mLs (0.3 mg total) into the muscle as needed for anaphylaxis.  Marland Kitchen imipramine (TOFRANIL) 10 MG tablet TAKE 1 TABLET BY MOUTH EVERYDAY AT BEDTIME  . sertraline (ZOLOFT) 100 MG tablet Take 1.5 tablets (150 mg total) by mouth daily.  . valACYclovir (VALTREX) 1000 MG tablet Take 1 tablet (1,000 mg total) by mouth 3 (three) times daily.  . [DISCONTINUED] azithromycin (ZITHROMAX) 250 MG tablet Take 2 tablets (500mg ) today then 1 tablet (250mg ) on days 2-5  . [DISCONTINUED] sertraline (ZOLOFT) 100 MG tablet TAKE 1 TABLET BY MOUTH EVERY DAY   Social History   Tobacco Use  . Smoking status: Never Smoker  . Smokeless tobacco: Never Used  Substance Use Topics  . Alcohol use: Yes    Alcohol/week: 10.0 standard drinks     Types: 10 Cans of beer per week    Comment: occ   Family History  Problem Relation Age of Onset  . Stomach cancer Father   . Esophageal cancer Father   . Heart attack Paternal Grandfather 80  . CAD Maternal Grandmother   . Cancer Maternal Grandfather   . Melanoma Paternal Grandmother 65     Review of Systems  Constitutional: Negative for activity change, appetite change, chills, fatigue, fever and unexpected weight change.  HENT: Negative for congestion, ear pain, hearing loss, sinus pressure, sinus pain, sore throat and trouble swallowing.   Eyes: Negative for pain and visual disturbance.  Respiratory: Negative for cough, chest tightness, shortness of breath and wheezing.   Cardiovascular: Negative for chest pain, palpitations and leg swelling.  Gastrointestinal: Negative for abdominal distention, abdominal pain, blood in stool, constipation, diarrhea, nausea and vomiting.  Genitourinary: Negative for decreased urine volume, difficulty urinating, dysuria, penile pain and testicular pain.  Musculoskeletal: Negative for arthralgias, back pain and joint swelling.  Skin: Negative for rash.  Neurological: Negative for dizziness, weakness, numbness and headaches.  Hematological: Negative for adenopathy. Does not bruise/bleed easily.  Psychiatric/Behavioral: Negative for agitation, sleep disturbance and suicidal ideas. The patient is not nervous/anxious.     CBC:  Lab Results  Component Value Date   WBC 6.7 05/09/2020   HGB 15.2 05/09/2020   HGB 15.1 11/26/2008  HCT 44.1 05/09/2020   HCT 43.0 11/26/2008   MCH 31.9 11/26/2008   MCHC 34.6 05/09/2020   RDW 12.9 05/09/2020   RDW 12.8 11/26/2008   PLT 212.0 05/09/2020   PLT 177 11/26/2008   CMP: Lab Results  Component Value Date   NA 138 05/09/2020   K 4.5 05/09/2020   CL 102 05/09/2020   CO2 28 05/09/2020   GLUCOSE 98 05/09/2020   GLUCOSE 102 (H) 02/11/2006   BUN 17 05/09/2020   CREATININE 0.90 05/09/2020   GFRAA 99  02/18/2008   CALCIUM 9.9 05/09/2020   PROT 7.6 05/09/2020   BILITOT 0.5 05/09/2020   ALKPHOS 59 05/09/2020   ALT 32 05/09/2020   AST 26 05/09/2020   LIPID: Lab Results  Component Value Date   CHOL 281 (H) 05/09/2020   TRIG 189.0 (H) 05/09/2020   TRIG 60 02/11/2006   HDL 57.80 05/09/2020   LDLCALC 185 (H) 05/09/2020    Objective:  BP (!) 150/100 (BP Location: Left Arm, Patient Position: Sitting, Cuff Size: Large)   Pulse 72   Temp 97.6 F (36.4 C) (Oral)   Ht 6' 4.5" (1.943 m)   Wt 221 lb (100.2 kg)   SpO2 97%   BMI 26.55 kg/m   Weight: 221 lb (100.2 kg)   BP Readings from Last 3 Encounters:  05/09/20 (!) 150/100  12/31/18 120/84  05/14/18 140/80   Wt Readings from Last 3 Encounters:  05/09/20 221 lb (100.2 kg)  12/31/18 216 lb 4.8 oz (98.1 kg)  05/14/18 219 lb (99.3 kg)    Physical Exam Constitutional:      General: He is not in acute distress.    Appearance: He is well-developed and well-nourished.  HENT:     Head: Normocephalic and atraumatic.     Right Ear: External ear normal.     Left Ear: External ear normal.     Nose: Nose normal.     Mouth/Throat:     Mouth: Oropharynx is clear and moist.     Pharynx: No oropharyngeal exudate.  Eyes:     Conjunctiva/sclera: Conjunctivae normal.     Pupils: Pupils are equal, round, and reactive to light.  Neck:     Thyroid: No thyromegaly.  Cardiovascular:     Rate and Rhythm: Normal rate and regular rhythm.     Pulses: Intact distal pulses.     Heart sounds: Normal heart sounds. No murmur heard. No friction rub. No gallop.   Pulmonary:     Effort: Pulmonary effort is normal. No respiratory distress.     Breath sounds: Normal breath sounds. No stridor. No wheezing or rales.  Abdominal:     General: Bowel sounds are normal.     Palpations: Abdomen is soft.  Musculoskeletal:        General: Normal range of motion.     Cervical back: Neck supple.  Skin:    General: Skin is warm and dry.     Comments:  Actinic keratosis ears, forehead, temples  Neurological:     Mental Status: He is alert and oriented to person, place, and time.  Psychiatric:        Mood and Affect: Mood and affect normal.        Behavior: Behavior normal.        Thought Content: Thought content normal.        Judgment: Judgment normal.     Assessment/Plan: Health Maintenance Due  Topic Date Due  . COLONOSCOPY (Pts 45-71yrs Insurance coverage  will need to be confirmed)  Never done  . INFLUENZA VACCINE  10/11/2019  . COVID-19 Vaccine (2 - Pfizer risk 4-dose series) 11/24/2019   Health Maintenance reviewed.  1. Preventative health care Discussed limiting alcohol for overall health and bp benefit; see below. He looks forward to returning to more regular activity level once knee is feeling better.  - Hepatitis C antibody; Future - Hepatitis C antibody  2. Elevated blood pressure reading Improved on recheck in the office. Advised monitoring at home and reporting back if noting numbers regularly over 130/85. Discussed low sodium and healthier overall diet.  - EKG 12-Lead - CBC with Differential/Platelet; Future - Comprehensive metabolic panel; Future - TSH; Future - TSH - Comprehensive metabolic panel - CBC with Differential/Platelet  3. Type A personality Has done well with increased dose of zoloft.  - sertraline (ZOLOFT) 100 MG tablet; Take 1.5 tablets (150 mg total) by mouth daily.  Dispense: 135 tablet; Refill: 3  4. Actinic keratosis He has seen derm in the past; encouraged follow up for monitoring. - Ambulatory referral to Dermatology  5. Nocturnal enuresis Does well with imipramine. Has been symptom free with this.   6. Screening for diabetes mellitus - Hemoglobin A1c; Future - Hemoglobin A1c  7. Lipid screening - Lipid panel; Future - Lipid panel  8. Surgery, elective - Urinalysis; Future - Protime-INR; Future - Protime-INR - Urinalysis  Return in about 3 months (around 08/06/2020) for  blood pressure recheck.   He is medically stable for surgery.   [name], MD

## 2020-05-10 LAB — HEPATITIS C ANTIBODY
Hepatitis C Ab: NONREACTIVE
SIGNAL TO CUT-OFF: 0.02 (ref <1.00)

## 2020-05-13 ENCOUNTER — Other Ambulatory Visit: Payer: Self-pay | Admitting: Family Medicine

## 2020-05-13 DIAGNOSIS — R030 Elevated blood-pressure reading, without diagnosis of hypertension: Secondary | ICD-10-CM

## 2020-05-13 MED ORDER — AMLODIPINE BESYLATE 5 MG PO TABS: 5.0000 mg | ORAL_TABLET | Freq: Every day | ORAL | 3 refills | Status: DC

## 2020-05-31 ENCOUNTER — Encounter: Payer: Self-pay | Admitting: Family Medicine

## 2020-06-01 ENCOUNTER — Telehealth: Payer: Self-pay | Admitting: *Deleted

## 2020-06-01 NOTE — Telephone Encounter (Signed)
Fax sent to be scanned.

## 2021-04-05 ENCOUNTER — Encounter: Payer: Self-pay | Admitting: Registered Nurse

## 2021-04-05 ENCOUNTER — Ambulatory Visit (INDEPENDENT_AMBULATORY_CARE_PROVIDER_SITE_OTHER): Payer: 59 | Admitting: Registered Nurse

## 2021-04-05 ENCOUNTER — Other Ambulatory Visit: Payer: Self-pay

## 2021-04-05 VITALS — BP 144/76 | HR 74 | Temp 98.3°F | Resp 18 | Ht 76.5 in | Wt 222.8 lb

## 2021-04-05 DIAGNOSIS — R0989 Other specified symptoms and signs involving the circulatory and respiratory systems: Secondary | ICD-10-CM

## 2021-04-05 MED ORDER — MONTELUKAST SODIUM 10 MG PO TABS: 10.0000 mg | ORAL_TABLET | Freq: Every day | ORAL | 3 refills | Status: DC

## 2021-04-05 MED ORDER — AZELASTINE HCL 0.1 % NA SOLN: 1.0000 | Freq: Two times a day (BID) | NASAL | 12 refills | Status: DC

## 2021-04-05 MED ORDER — PREDNISONE 10 MG PO TABS: ORAL_TABLET | ORAL | 0 refills | Status: AC

## 2021-04-05 MED ORDER — FLUTICASONE PROPIONATE 50 MCG/ACT NA SUSP: 2.0000 | Freq: Every day | NASAL | 6 refills | Status: DC

## 2021-04-05 NOTE — Progress Notes (Signed)
Established Patient Office Visit  Subjective:  Patient ID: Jose Reyes, male    DOB: 07-05-1975  Age: 46 y.o. MRN: [id number]  CC:  Chief Complaint  Patient presents with   Ear Pain    Patient  states he feels like he has a ear infection in his right ear. Patient states [facility] and has has abx and did not work.    HPI Jose Reyes presents for ear pain  Upper respiratory infection starting 03/03/21 [facility], given doxycycline and medrol dose pack Improved but not resolved, had ear pain result. Seen again 03/14/21, given augmentin Seen on 03/16/21 as he had developed cough, sore throat. Tested negative for flu, strep  Notes ongoing ear pressure, R>L No drainage. Feels like pressure change on airplane.  Some lingering cough Symptoms worse at night. Has completed steroid course and abx, otherwise no meds.   Past Medical History:  Diagnosis Date   Reflux    Testicular cancer ([place]) 1997   treated with surgery x 2, chemo.     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   testicular Right 1997   cancer   TOOTH EXTRACTION     wisdom teeth and tooth injury    Family History  Problem Relation Age of Onset   Stomach cancer Father    Esophageal cancer Father    Heart attack Paternal Grandfather 12   CAD Maternal Grandmother    Cancer Maternal Grandfather    Melanoma Paternal Grandmother 26    Social History   Socioeconomic History   Marital status: Single    Spouse name: Not on file   Number of children: Not on file   Years of education: Not on file   Highest education level: Not on file  Occupational History   Not on file  Tobacco Use   Smoking status: Never   Smokeless tobacco: Never  Substance and Sexual Activity   Alcohol use: Yes    Alcohol/week: 10.0 standard drinks    Types: 10 Cans of beer per  week    Comment: occ   Drug use: No   Sexual activity: Yes  Other Topics Concern   Not on file  Social History Narrative   Not on file   Social Determinants of Health   Financial Resource Strain: Not on file  Food Insecurity: Not on file  Transportation Needs: Not on file  Physical Activity: Not on file  Stress: Not on file  Social Connections: Not on file  Intimate Partner Violence: Not on file    Outpatient Medications Prior to Visit  Medication Sig Dispense Refill   acyclovir (ZOVIRAX) 400 MG tablet Take 1 tablet (400 mg total) by mouth 5 (five) times daily. Use for 5 days after onset of symptoms 25 tablet 2   amLODipine (NORVASC) 5 MG tablet Take 1 tablet (5 mg total) by mouth daily. 90 tablet 3   EPINEPHrine (EPIPEN 2-PAK) 0.3 mg/0.3 mL IJ SOAJ injection Inject 0.3 mLs (0.3 mg total) into the muscle as needed for anaphylaxis. 1 Device 2   imipramine (TOFRANIL) 10 MG tablet TAKE 1 TABLET BY MOUTH EVERYDAY AT BEDTIME 90 tablet 0   sertraline (ZOLOFT) 100 MG tablet Take 1.5 tablets (150 mg total) by mouth daily. 135 tablet 3   valACYclovir (VALTREX) 1000 MG tablet Take 1  tablet (1,000 mg total) by mouth 3 (three) times daily. 21 tablet 0   triamcinolone ointment (KENALOG) 0.5 % Apply 1 application topically 2 (two) times daily. (Patient not taking: Reported on 05/09/2020) 30 g 0   No facility-administered medications prior to visit.    Allergies  Allergen Reactions   Penicillins Hives    REACTION: unknown REACTION: unknown    Bee Venom     ROS Review of Systems  Constitutional: Negative.   HENT:  Positive for ear pain and sore throat.   Eyes: Negative.   Respiratory:  Positive for cough.   Cardiovascular: Negative.   Gastrointestinal: Negative.   Genitourinary: Negative.   Musculoskeletal: Negative.   Skin: Negative.   Neurological: Negative.   Psychiatric/Behavioral: Negative.       Objective:    Physical Exam Constitutional:      General: He is not in  acute distress.    Appearance: Normal appearance. He is normal weight. He is not ill-appearing, toxic-appearing or diaphoretic.  HENT:     Right Ear: Hearing, ear canal and external ear normal. Tympanic membrane is bulging.     Left Ear: Hearing, tympanic membrane, ear canal and external ear normal. No decreased hearing noted.  Cardiovascular:     Rate and Rhythm: Normal rate and regular rhythm.     Heart sounds: Normal heart sounds. No murmur heard.   No friction rub. No gallop.  Pulmonary:     Effort: Pulmonary effort is normal. No respiratory distress.     Breath sounds: Normal breath sounds. No stridor. No wheezing, rhonchi or rales.  Chest:     Chest wall: No tenderness.  Neurological:     General: No focal deficit present.     Mental Status: He is alert and oriented to person, place, and time. Mental status is at baseline.  Psychiatric:        Mood and Affect: Mood normal.        Behavior: Behavior normal.        Thought Content: Thought content normal.        Judgment: Judgment normal.    BP (!) 144/76    Pulse 74    Temp 98.3 F (36.8 C) (Temporal)    Resp 18    Ht 6' 4.5" (1.943 m)    Wt 222 lb 12.8 oz (101.1 kg)    SpO2 100%    BMI 26.77 kg/m  Wt Readings from Last 3 Encounters:  04/05/21 222 lb 12.8 oz (101.1 kg)  05/09/20 221 lb (100.2 kg)  12/31/18 216 lb 4.8 oz (98.1 kg)     Health Maintenance Due  Topic Date Due   COLONOSCOPY (Pts 45-53yrs Insurance coverage will need to be confirmed)  Never done   COVID-19 Vaccine (2 - Pfizer risk series) 11/24/2019   INFLUENZA VACCINE  10/10/2020    There are no preventive care reminders to display for this patient.  Lab Results  Component Value Date   TSH 1.73 05/09/2020   Lab Results  Component Value Date   WBC 6.7 05/09/2020   HGB 15.2 05/09/2020   HCT 44.1 05/09/2020   MCV 91.0 05/09/2020   PLT 212.0 05/09/2020   Lab Results  Component Value Date   NA 138 05/09/2020   K 4.5 05/09/2020   CO2 28 05/09/2020    GLUCOSE 98 05/09/2020   BUN 17 05/09/2020   CREATININE 0.90 05/09/2020   BILITOT 0.5 05/09/2020   ALKPHOS 59 05/09/2020   AST 26 05/09/2020  ALT 32 05/09/2020   PROT 7.6 05/09/2020   ALBUMIN 4.6 05/09/2020   CALCIUM 9.9 05/09/2020   GFR 102.27 05/09/2020   Lab Results  Component Value Date   CHOL 281 (H) 05/09/2020   Lab Results  Component Value Date   HDL 57.80 05/09/2020   Lab Results  Component Value Date   LDLCALC 185 (H) 05/09/2020   Lab Results  Component Value Date   TRIG 189.0 (H) 05/09/2020   Lab Results  Component Value Date   CHOLHDL 5 05/09/2020   Lab Results  Component Value Date   HGBA1C 5.4 05/09/2020      Assessment & Plan:   Problem List Items Addressed This Visit   None Visit Diagnoses     Symptoms of upper respiratory infection (URI)    -  Primary   Relevant Medications   montelukast (SINGULAIR) 10 MG tablet   fluticasone (FLONASE) 50 MCG/ACT nasal spray   azelastine (ASTELIN) 0.1 % nasal spray   predniSONE (DELTASONE) 10 MG tablet       Meds ordered this encounter  Medications   montelukast (SINGULAIR) 10 MG tablet    Sig: Take 1 tablet (10 mg total) by mouth at bedtime.    Dispense:  30 tablet    Refill:  3    Order Specific Question:   Supervising Provider    Answer:   Carlota Raspberry, JEFFREY R [[id number]]   fluticasone (FLONASE) 50 MCG/ACT nasal spray    Sig: Place 2 sprays into both nostrils daily.    Dispense:  16 g    Refill:  6    Order Specific Question:   Supervising Provider    Answer:   Carlota Raspberry, JEFFREY R [[id number]]   azelastine (ASTELIN) 0.1 % nasal spray    Sig: Place 1 spray into both nostrils 2 (two) times daily. Use in each nostril as directed    Dispense:  30 mL    Refill:  12    Order Specific Question:   Supervising Provider    Answer:   Carlota Raspberry, JEFFREY R [[id number]]   predniSONE (DELTASONE) 10 MG tablet    Sig: Take 4 tablets (40 mg total) by mouth daily with breakfast for 2 days, THEN 3 tablets (30 mg total) daily with  breakfast for 2 days, THEN 2 tablets (20 mg total) daily with breakfast for 2 days, THEN 1 tablet (10 mg total) daily with breakfast for 2 days.    Dispense:  20 tablet    Refill:  0    Order Specific Question:   Supervising Provider    Answer:   Carlota Raspberry, JEFFREY R [[id number]]    Follow-up: Return if symptoms worsen or fail to improve.   PLAN Fluticasone and azelastine daily.  Prednisone taper as above Montelukast nightly. Discussed risks with this medication. Pt voices understanding Can consider ENT referral if symptoms fail to improve. Patient encouraged to call clinic with any questions, comments, or concerns.  Maximiano Coss, NP

## 2021-04-05 NOTE — Patient Instructions (Addendum)
Jose Reyes -   Doristine Devoid to meet you  Start prednisone taper and finish entire course.  Use fluticasone in the mornings, azelastine in the evenings.  Can use montelukast nightly. May alter mood - if this happens, stop taking this and let me know  Thanks,  Rich     If you have lab work done today you will be contacted with your lab results within the next 2 weeks.  If you have not heard from [place] then please contact us. The fastest way to get your results is to register for My Chart.   IF you received an x-ray today, you will receive an invoice from [facility]. Please contact [facility] at [phone] with questions or concerns regarding your invoice.   IF you received labwork today, you will receive an invoice from [facility]. Please contact [facility] at [phone] with questions or concerns regarding your invoice.   Our billing staff will not be able to assist you with questions regarding bills from these companies.  You will be contacted with the lab results as soon as they are available. The fastest way to get your results is to activate your My Chart account. Instructions are located on the last page of this paperwork. If you have not heard from [place] regarding the results in 2 weeks, please contact this office.

## 2021-04-07 ENCOUNTER — Other Ambulatory Visit: Payer: Self-pay | Admitting: Registered Nurse

## 2021-04-07 ENCOUNTER — Telehealth: Payer: Self-pay | Admitting: Registered Nurse

## 2021-04-07 DIAGNOSIS — R0989 Other specified symptoms and signs involving the circulatory and respiratory systems: Secondary | ICD-10-CM

## 2021-04-07 NOTE — Telephone Encounter (Signed)
ENT referral placed Recommend continued symptom relief measures for onw  Thanks,  Rich

## 2021-04-07 NOTE — Telephone Encounter (Signed)
Pt has reported he is using recommended spray and taking montelukast, no improvement in sxs some worsened pain, pressure still present in ears. You had mentioned in AVS a potential referral would his sxs as is warrant the referral? Do you wish for him to wait longer before referring?  Please advise

## 2021-04-07 NOTE — Telephone Encounter (Signed)
Pt states that the pressure hasn't changed, he now has some pain in the ear. Pt states that he has been doing what Richard recommended and just wanted to know what to do. Please advise pt can be reached at the home #

## 2021-04-10 NOTE — Telephone Encounter (Signed)
Pt informed

## 2021-04-12 ENCOUNTER — Encounter: Payer: Self-pay | Admitting: Family Medicine

## 2021-04-12 ENCOUNTER — Ambulatory Visit (INDEPENDENT_AMBULATORY_CARE_PROVIDER_SITE_OTHER): Payer: 59 | Admitting: Family Medicine

## 2021-04-12 DIAGNOSIS — H6981 Other specified disorders of Eustachian tube, right ear: Secondary | ICD-10-CM | POA: Diagnosis not present

## 2021-04-12 DIAGNOSIS — M26621 Arthralgia of right temporomandibular joint: Secondary | ICD-10-CM | POA: Diagnosis not present

## 2021-04-12 NOTE — Patient Instructions (Signed)
*  continue with the flonase (fluticasone). I would add an anti-histamine (claritin, zyrtec or allegra - you do not needthe "decongestant" type).   *be mindful of over-chewing/clenching the jaw.

## 2021-04-12 NOTE — Progress Notes (Signed)
Jose Reyes DOB: [birth year] Encounter date: 04/12/2021  This is a 46 y.o. male who presents with right ear discomfort.  History of present illness: Ear has been bothering him for a month now. [facility]. First time he was given antibiotic. Not pain, but mostly feels like pressure/blockage. No hearing loss. At night feels worse. Then took penicillin (even though it is listed as allergy - did ok with this). Was given nasal spray and steroid which he has been taking since weds. Didn't feel like it was getting better in 48 hours; referred to ENT, but can't get in until February.   Not ringing in ear, just pressure.   12/23: medrol dosepack, doxycycline.  03/14/21: suppurative otitis media; given augmentin (exam documented as effusion) 04/05/21: flonase, azelastine, prednisone taper, singulair.  Describes discomfort as worse at night. Hasn't taken otc medication to help with pain except advil.   Allergies  Allergen Reactions   Penicillins Hives    REACTION: unknown REACTION: unknown    Bee Venom    Current Meds  Medication Sig   acyclovir (ZOVIRAX) 400 MG tablet Take 1 tablet (400 mg total) by mouth 5 (five) times daily. Use for 5 days after onset of symptoms   amLODipine (NORVASC) 5 MG tablet Take 1 tablet (5 mg total) by mouth daily.   azelastine (ASTELIN) 0.1 % nasal spray Place 1 spray into both nostrils 2 (two) times daily. Use in each nostril as directed   EPINEPHrine (EPIPEN 2-PAK) 0.3 mg/0.3 mL IJ SOAJ injection Inject 0.3 mLs (0.3 mg total) into the muscle as needed for anaphylaxis.   fluticasone (FLONASE) 50 MCG/ACT nasal spray Place 2 sprays into both nostrils daily.   imipramine (TOFRANIL) 10 MG tablet TAKE 1 TABLET BY MOUTH EVERYDAY AT BEDTIME   montelukast (SINGULAIR) 10 MG tablet Take 1 tablet (10 mg total) by mouth at bedtime.   predniSONE (DELTASONE) 10 MG tablet Take 4 tablets (40 mg total) by mouth daily with breakfast for 2 days, THEN 3 tablets (30 mg  total) daily with breakfast for 2 days, THEN 2 tablets (20 mg total) daily with breakfast for 2 days, THEN 1 tablet (10 mg total) daily with breakfast for 2 days.   sertraline (ZOLOFT) 100 MG tablet Take 1.5 tablets (150 mg total) by mouth daily.   triamcinolone ointment (KENALOG) 0.5 % Apply 1 application topically 2 (two) times daily.   valACYclovir (VALTREX) 1000 MG tablet Take 1 tablet (1,000 mg total) by mouth 3 (three) times daily.    Review of Systems  Constitutional:  Negative for chills, fatigue and fever.  HENT:  Positive for postnasal drip. Negative for congestion, ear discharge, facial swelling, hearing loss, rhinorrhea, sinus pressure, sinus pain, sneezing, sore throat and trouble swallowing. Ear pain: more pressure than pain.  Respiratory:  Negative for cough, chest tightness, shortness of breath and wheezing.   Cardiovascular:  Negative for chest pain, palpitations and leg swelling.   Objective:  There were no vitals taken for this visit.      BP Readings from Last 3 Encounters:  04/05/21 (!) 144/76  05/09/20 138/88  12/31/18 120/84   Wt Readings from Last 3 Encounters:  04/05/21 222 lb 12.8 oz (101.1 kg)  05/09/20 221 lb (100.2 kg)  12/31/18 216 lb 4.8 oz (98.1 kg)    Physical Exam Constitutional:      General: He is not in acute distress.    Appearance: He is well-developed.  HENT:     Right Ear:  Hearing, ear canal and external ear normal. There is no impacted cerumen. Tympanic membrane is not injected, scarred, perforated, erythematous, retracted or bulging.     Left Ear: Hearing, tympanic membrane, ear canal and external ear normal. There is no impacted cerumen. Tympanic membrane is not injected, scarred, perforated, erythematous, retracted or bulging.     Ears:     Comments: TM is dull on right side  Mild TMJ tenderness right >L Cardiovascular:     Rate and Rhythm: Normal rate and regular rhythm.     Heart sounds: Normal heart sounds. No murmur heard.   No  friction rub.  Pulmonary:     Effort: Pulmonary effort is normal. No respiratory distress.     Breath sounds: Normal breath sounds. No wheezing or rales.  Musculoskeletal:     Right lower leg: No edema.     Left lower leg: No edema.  Neurological:     Mental Status: He is alert and oriented to person, place, and time.  Psychiatric:        Behavior: Behavior normal.    Assessment/Plan  1. Eustachian tube dysfunction, right Encouraged him to continue with treatment given at last urgent care visit. Discussed that clearing after infection can take some time; and that eustachian tube can take a little longer to recover after effusion. Continue with flonase, suggested anti-histamine. Keep follow up with ENT.   2. TMJ tenderness, right Encouraged mindfulness with jaw/muscles. Avoid excessive chewing discussed TMJ release (suspect he was clenching more when ear was infected).    Return for physical exam.    [name], MD

## 2021-07-15 ENCOUNTER — Other Ambulatory Visit: Payer: Self-pay | Admitting: Family Medicine

## 2021-07-15 DIAGNOSIS — Z731 Type A behavior pattern: Secondary | ICD-10-CM

## 2021-09-21 ENCOUNTER — Other Ambulatory Visit: Payer: Self-pay | Admitting: *Deleted

## 2021-09-21 ENCOUNTER — Telehealth: Payer: Self-pay | Admitting: Family Medicine

## 2021-09-21 DIAGNOSIS — R32 Unspecified urinary incontinence: Secondary | ICD-10-CM

## 2021-09-21 MED ORDER — IMIPRAMINE HCL 10 MG PO TABS: ORAL_TABLET | ORAL | 0 refills | Status: DC

## 2021-09-21 NOTE — Telephone Encounter (Signed)
Pt requesting a refill of imipramine (TOFRANIL) 10 MG tablet  [id number] [place],  - [id number] Phone:  [phone] Fax:  [phone]

## 2021-09-22 NOTE — Telephone Encounter (Signed)
Left a detailed message stating [facility].

## 2021-11-28 ENCOUNTER — Ambulatory Visit (INDEPENDENT_AMBULATORY_CARE_PROVIDER_SITE_OTHER): Payer: 59 | Admitting: Family Medicine

## 2021-11-28 ENCOUNTER — Encounter: Payer: Self-pay | Admitting: Family Medicine

## 2021-11-28 DIAGNOSIS — H00016 Hordeolum externum left eye, unspecified eyelid: Secondary | ICD-10-CM | POA: Insufficient documentation

## 2021-11-28 DIAGNOSIS — R32 Unspecified urinary incontinence: Secondary | ICD-10-CM

## 2021-11-28 DIAGNOSIS — H00014 Hordeolum externum left upper eyelid: Secondary | ICD-10-CM | POA: Diagnosis not present

## 2021-11-28 MED ORDER — IMIPRAMINE HCL 10 MG PO TABS: ORAL_TABLET | ORAL | 1 refills | Status: DC

## 2021-11-28 MED ORDER — DOXYCYCLINE MONOHYDRATE 100 MG PO CAPS: 100.0000 mg | ORAL_CAPSULE | Freq: Two times a day (BID) | ORAL | 0 refills | Status: AC

## 2021-11-28 NOTE — Assessment & Plan Note (Addendum)
Recurrent, has 2 hordeolums present, one on the lower eyelid and one on the upper, this is the 2nd recent event with hordeolum, he did do the hot compresses initially-- he will start oral antibiotics to speed up the healing process, especially since he failed conservative measures and the eyelids are erythematous and becoming swollen. Start doxycycline 100 mg BID for 7 days.

## 2021-11-28 NOTE — Progress Notes (Signed)
   Established Patient Office Visit  Subjective   Patient ID: Jose Reyes, male    DOB: [birth year]  Age: 46 y.o. MRN: [id number]  Chief Complaint  Patient presents with   Eye Problem    Patient complains of left eye stye x2 weeks ago which has resolved, now complains of redness, swelling and crust on the eye x1 day    States that he had a stye in the right eye but it has since resolved, then this morning he woke up with crusting, redness and itching, states that he also had a stye on the left eye prior to this.   Eye Problem  The left eye is affected. This is a new problem. The current episode started today. The problem occurs constantly. The problem has been gradually worsening. Associated symptoms include blurred vision, an eye discharge, eye redness, a foreign body sensation and itching. Pertinent negatives include no nausea or vomiting. He has tried nothing for the symptoms.      Review of Systems  Eyes:  Positive for blurred vision, discharge, redness and itching.  Gastrointestinal:  Negative for nausea and vomiting.  All other systems reviewed and are negative.     Objective:     BP 118/80 (BP Location: Left Arm, Patient Position: Sitting, Cuff Size: Large)   Pulse 62   Temp 97.8 F (36.6 C) (Oral)   Ht 6' 4.5" (1.943 m)   Wt 211 lb 1.6 oz (95.8 kg)   SpO2 98%   BMI 25.36 kg/m    Physical Exam Eyes:     General:        Left eye: Discharge present.    Conjunctiva/sclera: Conjunctivae normal.     Left eye: Left conjunctiva is not injected. Exudate present.      No results found for any visits on 11/28/21.    The 10-year ASCVD risk score (Arnett DK, et al., 2019) is: 3.5%    Assessment & Plan:   Problem List Items Addressed This Visit       Other   Hordeolum externum of left eye    Recurrent, has 2 hordeolums present, one on the lower eyelid and one on the upper, this is the 2nd recent event with hordeolum, he did do the hot compresses initially--  he will start oral antibiotics to speed up the healing process, especially since he failed conservative measures and the eyelids are erythematous and becoming swollen. Start doxycycline 100 mg BID for 7 days.      Relevant Medications   doxycycline (MONODOX) 100 MG capsule   Other Visit Diagnoses     Urinary incontinence, unspecified type       Relevant Medications   imipramine (TOFRANIL) 10 MG tablet       Return in about 5 months (around 04/30/2022) for Annual physical exam.    Farrel Conners, MD

## 2021-11-28 NOTE — Patient Instructions (Signed)
GenTeal tears - -best brand to use OTC for artificial tears  Patanol or Pataday eye drops will help with itching

## 2022-01-15 ENCOUNTER — Other Ambulatory Visit: Payer: Self-pay | Admitting: *Deleted

## 2022-01-15 DIAGNOSIS — Z731 Type A behavior pattern: Secondary | ICD-10-CM

## 2022-01-15 MED ORDER — SERTRALINE HCL 100 MG PO TABS: ORAL_TABLET | ORAL | 1 refills | Status: DC

## 2022-03-09 ENCOUNTER — Ambulatory Visit: Payer: 59 | Admitting: Family Medicine

## 2022-03-15 ENCOUNTER — Telehealth: Payer: Self-pay

## 2022-03-15 NOTE — Telephone Encounter (Signed)
--  Caller stated [facility] was [facility] ago and dx with flu. Caller stated symptoms are not any better. He stated he is still getting sob and throat is extremely sore. No fever. Caller stated symptoms started at Christmas.  03/14/2022 4:17:33 PM SEE PCP WITHIN 3 DAYS Laurance Flatten, RN, Geni Bers  03/15/22 1030 - LVM instructions for pt to call back to schedule appt for tomorrow with any available provider.

## 2022-07-11 ENCOUNTER — Other Ambulatory Visit: Payer: Self-pay | Admitting: Family Medicine

## 2022-07-11 DIAGNOSIS — Z731 Type A behavior pattern: Secondary | ICD-10-CM

## 2022-08-10 ENCOUNTER — Other Ambulatory Visit: Payer: Self-pay | Admitting: Family Medicine

## 2022-08-10 DIAGNOSIS — Z731 Type A behavior pattern: Secondary | ICD-10-CM

## 2022-09-04 ENCOUNTER — Other Ambulatory Visit: Payer: Self-pay | Admitting: Family Medicine

## 2022-09-04 DIAGNOSIS — Z731 Type A behavior pattern: Secondary | ICD-10-CM

## 2022-10-09 ENCOUNTER — Other Ambulatory Visit: Payer: Self-pay | Admitting: Family Medicine

## 2022-10-09 DIAGNOSIS — Z731 Type A behavior pattern: Secondary | ICD-10-CM

## 2022-10-11 ENCOUNTER — Telehealth: Payer: Self-pay | Admitting: Family Medicine

## 2022-10-11 DIAGNOSIS — Z731 Type A behavior pattern: Secondary | ICD-10-CM

## 2022-10-11 MED ORDER — SERTRALINE HCL 100 MG PO TABS: ORAL_TABLET | ORAL | 0 refills | Status: DC

## 2022-10-11 NOTE — Telephone Encounter (Signed)
[facility] as [facility] was [facility] on [facility]/30.  Jasmin stated the insurance will only cover a 90 day and she advised the patient call to schedule an appt.  I left a detailed message at the patient's cell number stating a 90-day supply was sent and he can call to schedule a visit with PCP and/or keep the appt with Dr Salomon Fick if he prefers.

## 2022-10-11 NOTE — Telephone Encounter (Signed)
Pt has been scheduled to come in on Monday - 10/15/22 to see Dr. Salomon Fick.   Pt is asking if MD can refill his  sertraline (ZOLOFT) 100 MG tablet Rx?  Please advise.

## 2022-10-15 ENCOUNTER — Ambulatory Visit (INDEPENDENT_AMBULATORY_CARE_PROVIDER_SITE_OTHER): Payer: 59 | Admitting: Family Medicine

## 2022-10-15 ENCOUNTER — Encounter: Payer: Self-pay | Admitting: Family Medicine

## 2022-10-15 VITALS — BP 144/76 | HR 71 | Temp 98.4°F | Ht 76.0 in | Wt 224.4 lb

## 2022-10-15 DIAGNOSIS — Z731 Type A behavior pattern: Secondary | ICD-10-CM | POA: Diagnosis not present

## 2022-10-15 DIAGNOSIS — R030 Elevated blood-pressure reading, without diagnosis of hypertension: Secondary | ICD-10-CM

## 2022-10-15 NOTE — Progress Notes (Signed)
Established Patient Office Visit   Subjective  Patient ID: Jose Reyes, male    DOB: [birth year]  Age: 47 y.o. MRN: [id number]  Chief Complaint  Patient presents with   Medication Refill    On Sertraline.     Patient is a 47 year old male followed by Dr. Casimiro Needle and seen for follow-up.  Patient was advised to schedule appointment for refills on Zoloft.  An 8-day supply of medication was actually sent to patient's pharmacy on 10/11/2022.  Patient states he has been doing well on the medication.  Denies issues with sleep, appetite, or energy.  States she tries to stay active by playing golf.  Also taking total T as he figures his testosterone is likely low given his age.  Medication Refill    Past Medical History:  Diagnosis Date   Reflux    Testicular cancer (HCC) 1997   treated with surgery x 2, chemo.    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   testicular Right 1997   cancer   TOOTH EXTRACTION     wisdom teeth and tooth injury   Social History   Tobacco Use   Smoking status: Never   Smokeless tobacco: Never  Substance Use Topics   Alcohol use: Yes    Alcohol/week: 10.0 standard drinks of alcohol    Types: 10 Cans of beer per week    Comment: occ   Drug use: No   Family History  Problem Relation Age of Onset   Stomach cancer Father    Esophageal cancer Father    Heart attack Paternal Grandfather 71   CAD Maternal Grandmother    Cancer Maternal Grandfather    Melanoma Paternal Grandmother 62   Allergies  Allergen Reactions   Penicillins Hives    REACTION: unknown REACTION: unknown    Bee Venom       ROS Negative unless stated above    Objective:     BP (!) 144/76 (BP Location: Left Arm, Patient Position: Sitting, Cuff Size: Large)   Pulse 71   Temp 98.4 F (36.9 C) (Oral)   Ht 6\' 4"  (1.93 m)   Wt 224 lb 6.4 oz (101.8  kg)   SpO2 96%   BMI 27.31 kg/m  BP Readings from Last 3 Encounters:  10/15/22 (!) 144/76  11/28/21 118/80  04/05/21 (!) 144/76   Wt Readings from Last 3 Encounters:  10/15/22 224 lb 6.4 oz (101.8 kg)  11/28/21 211 lb 1.6 oz (95.8 kg)  04/05/21 222 lb 12.8 oz (101.1 kg)      Physical Exam Constitutional:      General: He is not in acute distress.    Appearance: Normal appearance.  HENT:     Head: Normocephalic and atraumatic.     Nose: Nose normal.     Mouth/Throat:     Mouth: Mucous membranes are moist.  Cardiovascular:     Rate and Rhythm: Normal rate and regular rhythm.     Heart sounds: Normal heart sounds. No murmur heard.    No gallop.  Pulmonary:     Effort: Pulmonary effort is normal. No respiratory distress.     Breath sounds: Normal breath sounds. No wheezing, rhonchi or rales.  Skin:    General: Skin is warm and dry.  Neurological:     Mental Status: He is alert and oriented to person, place,  and time.       10/15/2022   10:41 AM 11/28/2021   10:55 AM 04/05/2021    3:37 PM 05/09/2020   10:40 AM 12/31/2018    9:37 AM  Depression screen PHQ 2/9  Decreased Interest 0 0 0 0 0  Down, Depressed, Hopeless 0 0 0 0 0  PHQ - 2 Score 0 0 0 0 0  Altered sleeping 0 1 0    Tired, decreased energy 0 1 0    Change in appetite 0 0 0    Feeling bad or failure about yourself  0 0 0    Trouble concentrating 0 0 0    Moving slowly or fidgety/restless 0 0 0    Suicidal thoughts 0 0 0    PHQ-9 Score 0 2 0    Difficult doing work/chores  Not difficult at all Not difficult at all        10/15/2022   10:42 AM  GAD 7 : Generalized Anxiety Score  Nervous, Anxious, on Edge 0  Control/stop worrying 0  Worry too much - different things 0  Trouble relaxing 0  Restless 0  Easily annoyed or irritable 0  Afraid - awful might happen 0  Total GAD 7 Score 0     No results found for any visits on 10/15/22.    Assessment & Plan:  Elevated blood pressure reading in office  without diagnosis of hypertension  Type A personality   BP elevation in clinic initially 140/100.  Recheck 144/76.  Patient advised to check BP at home and work on lifestyle modifications.  For continued elevation consistently greater than 140/90 start medication.  Patient advised refill on Zoloft 100 mg sent pharmacy on 10/11/2022 for 90-day supply by PCP.  Patient encouraged to schedule appointment for CPE and labs.  Return for physical.   Deeann Saint, MD

## 2023-01-09 ENCOUNTER — Other Ambulatory Visit: Payer: Self-pay | Admitting: Family Medicine

## 2023-01-09 DIAGNOSIS — Z731 Type A behavior pattern: Secondary | ICD-10-CM

## 2023-02-28 ENCOUNTER — Encounter: Payer: Self-pay | Admitting: Internal Medicine

## 2023-02-28 ENCOUNTER — Ambulatory Visit: Payer: 59 | Admitting: Internal Medicine

## 2023-02-28 VITALS — BP 148/91 | HR 82 | Temp 98.1°F | Wt 226.4 lb

## 2023-02-28 DIAGNOSIS — J069 Acute upper respiratory infection, unspecified: Secondary | ICD-10-CM

## 2023-02-28 LAB — POCT INFLUENZA A/B
Influenza A, POC: NEGATIVE
Influenza B, POC: NEGATIVE

## 2023-02-28 LAB — POC COVID19 BINAXNOW: SARS Coronavirus 2 Ag: NEGATIVE

## 2023-02-28 LAB — POCT RAPID STREP A (OFFICE): Rapid Strep A Screen: NEGATIVE

## 2023-02-28 NOTE — Progress Notes (Signed)
Established Patient Office Visit     CC/Reason for Visit: URI symptoms  HPI: Jose Reyes is a 47 y.o. male who is coming in today for the above mentioned reasons.  For the past 2 days has been experiencing URI symptoms including sore throat, cough, congestion and rhinorrhea.   Past Medical/Surgical History: Past Medical History:  Diagnosis Date   Reflux    Testicular cancer (HCC) 1997   treated with surgery x 2, chemo.     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 He has never used smokeless tobacco. He reports current alcohol use of about 10.0 standard drinks of alcohol per week. He reports that he does not use drugs.  Allergies: Allergies  Allergen Reactions   Penicillins Hives    REACTION: unknown REACTION: unknown    Bee Venom     Family History:  Family History  Problem Relation Age of Onset   Stomach cancer Father    Esophageal cancer Father    Heart attack Paternal Grandfather 17   CAD Maternal Grandmother    Cancer Maternal Grandfather    Melanoma Paternal Grandmother 60     Current Outpatient Medications:    sertraline (ZOLOFT) 100 MG tablet, TAKE 1.5 TABLETS (150 MG TOTAL) BY MOUTH DAILY, Disp: 135 tablet, Rfl: 1   valACYclovir (VALTREX) 1000 MG tablet, Take 1 tablet (1,000 mg total) by mouth 3 (three) times daily., Disp: 21 tablet, Rfl: 0   EPINEPHrine (EPIPEN 2-PAK) 0.3 mg/0.3 mL IJ SOAJ injection, Inject 0.3 mLs (0.3 mg total) into the muscle as needed for anaphylaxis. (Patient not taking: Reported on 02/28/2023), Disp: 1 Device, Rfl: 2   imipramine (TOFRANIL) 10 MG tablet, TAKE 1 TABLET BY MOUTH EVERYDAY AT BEDTIME (Patient not taking: Reported on 02/28/2023), Disp: 90 tablet,  Rfl: 1  Review of Systems:  Negative unless indicated in HPI.   Physical Exam: Vitals:   02/28/23 1523 02/28/23 1527  BP: (!) 160/100 (!) 148/91  Pulse: 82   Temp: 98.1 F (36.7 C)   TempSrc: Oral   SpO2: 99%   Weight: 226 lb 6.4 oz (102.7 kg)     Body mass index is 27.56 kg/m.   Physical Exam Vitals reviewed.  Constitutional:      Appearance: Normal appearance.  HENT:     Right Ear: Tympanic membrane, ear canal and external ear normal.     Left Ear: Tympanic membrane, ear canal and external ear normal.     Mouth/Throat:     Mouth: Mucous membranes are moist.     Pharynx: Oropharynx is clear.  Eyes:     Conjunctiva/sclera: Conjunctivae normal.     Pupils: Pupils are equal, round, and reactive to light.  Cardiovascular:     Rate and Rhythm: Normal rate and regular rhythm.  Pulmonary:     Effort: Pulmonary effort is normal.     Breath sounds: Normal breath sounds.  Neurological:     Mental Status: He is alert.      Impression and Plan:  Viral upper respiratory tract infection -     POC COVID-19 BinaxNow -     POCT Influenza A/B -  POCT rapid strep A  -Given exam findings, PNA, pharyngitis, ear infection are not likely, hence abx have not been prescribed. -Have advised rest, fluids, OTC antihistamines, cough suppressants and mucinex. -RTC if no improvement in 10-14 days. -In office flu, COVID and strep tests are negative.    Time spent:21 minutes reviewing chart, interviewing and examining patient and formulating plan of care.     Chaya Jan, MD [facility]

## 2023-02-28 NOTE — Progress Notes (Signed)
Because the patient has Covid like symptoms the front desk asked the patient to wear a mask.  The patient declined.  I asked the patient to wear a mask and he did.  Because his symptoms started 2-3 days ago a COVID and Flu test were recommended.  The patient refused a COVID test.  After discussing symptoms with Dr Ardyth Harps,  the patient agreed to the test.

## 2023-03-05 ENCOUNTER — Telehealth: Payer: Self-pay

## 2023-03-05 NOTE — Telephone Encounter (Signed)
Copied from CRM [id number]. Topic: Clinical - Medical Advice >> Mar 05, 2023 10:31 AM Elizebeth Brooking wrote: Reason for CRM: Patient stated he doesn't feel any better than he last appointment and needs some kind of medicine or to be tested again. Stated all of the over the counter medicines haven't work. He is also requesting a callback

## 2023-03-05 NOTE — Telephone Encounter (Signed)
Spoke with the patient and informed him our office does not have any openings today and he should seek treatment at an urgent care since he is not better.  Patient stated he needs an antibiotic for bronchitis and I attempted to inform him a visit is needed as medications are not called in.  He raised his voice, said "no", repeated the same thing  and hung up.

## 2023-04-18 ENCOUNTER — Other Ambulatory Visit: Payer: Self-pay | Admitting: Family Medicine

## 2023-04-18 DIAGNOSIS — R32 Unspecified urinary incontinence: Secondary | ICD-10-CM

## 2023-04-18 NOTE — Telephone Encounter (Signed)
>  1 year since I have seen him, he needs an appt and then ok to fill until his appt date.

## 2023-04-19 ENCOUNTER — Other Ambulatory Visit: Payer: Self-pay | Admitting: Family Medicine

## 2023-04-19 DIAGNOSIS — R32 Unspecified urinary incontinence: Secondary | ICD-10-CM

## 2023-04-19 NOTE — Telephone Encounter (Signed)
 Per chart, patient had remaining refills for requested medication. This RN attempted to notify patient, no answer.

## 2023-04-19 NOTE — Telephone Encounter (Signed)
 Copied from CRM [id number]. Topic: Clinical - Medication Refill >> Apr 19, 2023 11:30 AM Isabell A wrote: Most Recent Primary Care Visit:  Provider: THEOPHILUS DELMA TULLY CINDERELLA  Department: [facility]  Visit Type: ACUTE  Date: 02/28/2023  Medication: imipramine  (TOFRANIL ) 10 MG tablet  Has the patient contacted their pharmacy? Yes (Agent: If no, request that the patient contact the pharmacy for the refill. If patient does not wish to contact the pharmacy document the reason why and proceed with request.) (Agent: If yes, when and what did the pharmacy advise?)  Is this the correct pharmacy for this prescription? Yes If no, delete pharmacy and type the correct one.  This is the patient's preferred pharmacy:  [id number] - [place], [place] - [address] AT [address] [place] [address] Phone: [phone] Fax: [phone]   Has the prescription been filled recently? Yes  Is the patient out of the medication? Yes  Has the patient been seen for an appointment in the last year OR does the patient have an upcoming appointment? Yes  Can we respond through MyChart? Yes  Agent: Please be advised that Rx refills may take up to 3 business days. We ask that you follow-up with your pharmacy.

## 2023-04-22 NOTE — Telephone Encounter (Signed)
 Pt is going out of town tomorrow and he is aware last visit with dr Bambi Lever was sept 2023. Pt has an appt sch for 05-14-2023

## 2023-05-14 ENCOUNTER — Ambulatory Visit: Payer: 59 | Admitting: Family Medicine

## 2023-05-20 ENCOUNTER — Ambulatory Visit (INDEPENDENT_AMBULATORY_CARE_PROVIDER_SITE_OTHER): Payer: 59 | Admitting: Family Medicine

## 2023-05-20 ENCOUNTER — Encounter: Payer: Self-pay | Admitting: Family Medicine

## 2023-05-20 VITALS — BP 128/62 | HR 65 | Temp 97.6°F | Ht 76.0 in | Wt 227.3 lb

## 2023-05-20 DIAGNOSIS — R030 Elevated blood-pressure reading, without diagnosis of hypertension: Secondary | ICD-10-CM | POA: Diagnosis not present

## 2023-05-20 DIAGNOSIS — Z23 Encounter for immunization: Secondary | ICD-10-CM

## 2023-05-20 DIAGNOSIS — Z0000 Encounter for general adult medical examination without abnormal findings: Secondary | ICD-10-CM | POA: Diagnosis not present

## 2023-05-20 DIAGNOSIS — Z13220 Encounter for screening for lipoid disorders: Secondary | ICD-10-CM

## 2023-05-20 NOTE — Patient Instructions (Signed)
 Colon cancer screening is due-- please call me to let me know when I can order this for you.

## 2023-05-20 NOTE — Progress Notes (Signed)
 Complete physical exam  Patient: Jose Reyes   DOB: 10-Feb-1976   48 y.o. Male  MRN: [id number]  Subjective:    Chief Complaint  Patient presents with   Medical Management of Chronic Issues    Jose R Hommes is a 48 y.o. male who presents today for a complete physical exam. He reports consuming a general diet. Not currently on any vitamins or supplements. Gym/ health club routine includes cardio and mod to heavy weightlifting. Does this about 5 days per week. He generally feels well. He reports sleeping well. He does not have additional problems to discuss today.    Most recent fall risk assessment:    10/15/2022   10:38 AM  Fall Risk   Falls in the past year? 0  Number falls in past yr: 0  Injury with Fall? 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   imipramine (TOFRANIL) 10 MG tablet TAKE 1 TABLET BY MOUTH EVERYDAY AT BEDTIME   sertraline (ZOLOFT) 100 MG tablet TAKE 1.5 TABLETS (150 MG TOTAL) BY MOUTH DAILY   [DISCONTINUED] valACYclovir (VALTREX) 1000 MG tablet Take 1 tablet (1,000 mg total) by mouth 3 (three) times daily.   No facility-administered medications prior to visit.     Review of Systems  HENT:  Negative for hearing loss.   Eyes:  Negative for blurred vision.  Respiratory:  Negative for shortness of breath.   Cardiovascular:  Negative for chest pain.  Gastrointestinal: Negative.   Genitourinary: Negative.   Musculoskeletal:  Negative for back pain.  Neurological:  Negative for headaches.  Psychiatric/Behavioral:  Negative for depression.   All other systems reviewed and are negative.      Objective:     BP 128/62   Pulse 65   Temp 97.6 F (36.4 C) (Oral)   Ht 6\' 4"  (1.93 m)   Wt 227 lb 4.8 oz (103.1 kg)   SpO2 98%   BMI 27.67 kg/m    Physical Exam Vitals reviewed.  Constitutional:      Appearance: Normal appearance. He is well-groomed and normal weight.  HENT:     Right Ear: Tympanic membrane and ear canal normal.     Left Ear: Tympanic membrane and ear canal normal.     Mouth/Throat:     Mouth: Mucous membranes are moist.     Pharynx: No posterior oropharyngeal erythema.  Eyes:     Extraocular Movements: Extraocular movements intact.     Conjunctiva/sclera: Conjunctivae normal.  Neck:     Thyroid: No thyromegaly.  Cardiovascular:  Rate and Rhythm: Normal rate and regular rhythm.     Heart sounds: S1 normal and S2 normal. No murmur heard. Pulmonary:     Effort: Pulmonary effort is normal.     Breath sounds: Normal breath sounds and air entry. No rales.  Abdominal:     General: Abdomen is flat. Bowel sounds are normal.  Musculoskeletal:     Right lower leg: No edema.     Left lower leg: No edema.  Lymphadenopathy:     Cervical: No cervical adenopathy.  Neurological:     General: No focal deficit present.     Mental Status: He is alert and oriented to person, place, and time.     Gait: Gait is intact.  Psychiatric:        Mood and Affect: Mood and affect normal.      No results found for any visits on 05/20/23.     Assessment & Plan:    Routine Health Maintenance and Physical Exam  Immunization History   Administered Date(s) Administered   Influenza Split 03/21/2011, 01/23/2012   Influenza,inj,Quad PF,6+ Mos 12/31/2018   PFIZER(Purple Top)SARS-COV-2 Vaccination 11/03/2019   Tdap 01/23/2012    Health Maintenance  Topic Date Due   Colonoscopy  Never done   COVID-19 Vaccine (2 - Pfizer risk series) 11/24/2019   DTaP/Tdap/Td (2 - Td or Tdap) 01/22/2022   INFLUENZA VACCINE  06/10/2023 (Originally 10/11/2022)   Hepatitis C Screening  Completed   HIV Screening  Completed   HPV VACCINES  Aged Out    Discussed health benefits of physical activity, and encouraged him to engage in regular exercise appropriate for his age and condition.  Elevated blood pressure reading in office without diagnosis of hypertension -     Comprehensive metabolic panel; Future  Lipid screening -     Lipid panel; Future  Immunization due -     Tdap vaccine greater than or equal to 7yo IM  Routine physical examination  Normal physical exam findings today. I counseled the patient on colonoscopy and cologuard testing and the recommendations for each test. Pt is currently applying for life insurance and does not want to complete any testing until after this is complete. I wrote  a reminder on his paperwork. Handouts were given on healthy eating and exercise. Labs have been ordered for annual surveillance.   Return in about 1 year (around 05/19/2024) for annual physical exam.     Karie Georges, MD

## 2023-07-16 ENCOUNTER — Encounter (HOSPITAL_COMMUNITY): Payer: Self-pay | Admitting: Cardiovascular Disease

## 2023-07-16 ENCOUNTER — Other Ambulatory Visit: Payer: Self-pay

## 2023-07-16 ENCOUNTER — Telehealth (HOSPITAL_COMMUNITY): Payer: Self-pay | Admitting: Pharmacy Technician

## 2023-07-16 ENCOUNTER — Inpatient Hospital Stay (HOSPITAL_COMMUNITY)
Admission: EM | Admit: 2023-07-16 | Discharge: 2023-07-17 | DRG: 322 | Disposition: A | Discharge status: Home / Self Care | Attending: Cardiovascular Disease | Admitting: Cardiovascular Disease

## 2023-07-16 ENCOUNTER — Other Ambulatory Visit (HOSPITAL_COMMUNITY): Payer: Self-pay

## 2023-07-16 ENCOUNTER — Encounter (HOSPITAL_COMMUNITY)
Admission: EM | Disposition: A | Discharge status: Home / Self Care | Payer: Self-pay | Source: Home / Self Care | Attending: Cardiovascular Disease

## 2023-07-16 DIAGNOSIS — I2119 ST elevation (STEMI) myocardial infarction involving other coronary artery of inferior wall: Secondary | ICD-10-CM | POA: Diagnosis present

## 2023-07-16 DIAGNOSIS — E785 Hyperlipidemia, unspecified: Secondary | ICD-10-CM | POA: Diagnosis present

## 2023-07-16 DIAGNOSIS — I2121 ST elevation (STEMI) myocardial infarction involving left circumflex coronary artery: Secondary | ICD-10-CM | POA: Diagnosis not present

## 2023-07-16 DIAGNOSIS — I493 Ventricular premature depolarization: Secondary | ICD-10-CM | POA: Diagnosis present

## 2023-07-16 DIAGNOSIS — I2510 Atherosclerotic heart disease of native coronary artery without angina pectoris: Secondary | ICD-10-CM | POA: Diagnosis present

## 2023-07-16 DIAGNOSIS — I10 Essential (primary) hypertension: Secondary | ICD-10-CM | POA: Diagnosis present

## 2023-07-16 DIAGNOSIS — E7800 Pure hypercholesterolemia, unspecified: Secondary | ICD-10-CM | POA: Diagnosis not present

## 2023-07-16 DIAGNOSIS — Z8547 Personal history of malignant neoplasm of testis: Secondary | ICD-10-CM | POA: Diagnosis not present

## 2023-07-16 DIAGNOSIS — Z7902 Long term (current) use of antithrombotics/antiplatelets: Secondary | ICD-10-CM

## 2023-07-16 DIAGNOSIS — Z7982 Long term (current) use of aspirin: Secondary | ICD-10-CM

## 2023-07-16 DIAGNOSIS — Z79899 Other long term (current) drug therapy: Secondary | ICD-10-CM | POA: Diagnosis not present

## 2023-07-16 DIAGNOSIS — Z9221 Personal history of antineoplastic chemotherapy: Secondary | ICD-10-CM | POA: Diagnosis not present

## 2023-07-16 DIAGNOSIS — Z955 Presence of coronary angioplasty implant and graft: Secondary | ICD-10-CM

## 2023-07-16 DIAGNOSIS — Z8249 Family history of ischemic heart disease and other diseases of the circulatory system: Secondary | ICD-10-CM

## 2023-07-16 HISTORY — PX: LEFT HEART CATH AND CORONARY ANGIOGRAPHY: CATH118249

## 2023-07-16 HISTORY — PX: CORONARY/GRAFT ACUTE MI REVASCULARIZATION: CATH118305

## 2023-07-16 LAB — LIPID PANEL
Cholesterol: 238 mg/dL — ABNORMAL HIGH (ref 0–200)
HDL: 49 mg/dL (ref >40)
LDL Cholesterol: 177 mg/dL — ABNORMAL HIGH (ref 0–99)
Total CHOL/HDL Ratio: 4.9 ratio
Triglycerides: 61 mg/dL (ref <150)
VLDL: 12 mg/dL (ref 0–40)

## 2023-07-16 LAB — PROTIME-INR
INR: 1.4 — ABNORMAL HIGH (ref 0.8–1.2)
Prothrombin Time: 16.9 s — ABNORMAL HIGH (ref 11.4–15.2)

## 2023-07-16 LAB — CBC WITH DIFFERENTIAL/PLATELET
Abs Immature Granulocytes: 0.03 10*3/uL (ref 0.00–0.07)
Basophils Absolute: 0 10*3/uL (ref 0.0–0.1)
Basophils Relative: 0 %
Eosinophils Absolute: 0.1 10*3/uL (ref 0.0–0.5)
Eosinophils Relative: 1 %
HCT: 40.9 % (ref 39.0–52.0)
Hemoglobin: 14.5 g/dL (ref 13.0–17.0)
Immature Granulocytes: 0 %
Lymphocytes Relative: 13 %
Lymphs Abs: 1 10*3/uL (ref 0.7–4.0)
MCH: 31.6 pg (ref 26.0–34.0)
MCHC: 35.5 g/dL (ref 30.0–36.0)
MCV: 89.1 fL (ref 80.0–100.0)
Monocytes Absolute: 0.5 10*3/uL (ref 0.1–1.0)
Monocytes Relative: 7 %
Neutro Abs: 6.4 10*3/uL (ref 1.7–7.7)
Neutrophils Relative %: 79 %
Platelets: 170 10*3/uL (ref 150–400)
RBC: 4.59 MIL/uL (ref 4.22–5.81)
RDW: 12.6 % (ref 11.5–15.5)
WBC: 8.1 10*3/uL (ref 4.0–10.5)
nRBC: 0 % (ref 0.0–0.2)

## 2023-07-16 LAB — POCT I-STAT, CHEM 8
BUN: 13 mg/dL (ref 6–20)
Calcium, Ion: 1.13 mmol/L — ABNORMAL LOW (ref 1.15–1.40)
Chloride: 105 mmol/L (ref 98–111)
Creatinine, Ser: 0.8 mg/dL (ref 0.61–1.24)
Glucose, Bld: 155 mg/dL — ABNORMAL HIGH (ref 70–99)
HCT: 41 % (ref 39.0–52.0)
Hemoglobin: 13.9 g/dL (ref 13.0–17.0)
Potassium: 3.6 mmol/L (ref 3.5–5.1)
Sodium: 136 mmol/L (ref 135–145)
TCO2: 22 mmol/L (ref 22–32)

## 2023-07-16 LAB — POCT ACTIVATED CLOTTING TIME
Activated Clotting Time: 233 s
Activated Clotting Time: 349 s

## 2023-07-16 LAB — COMPREHENSIVE METABOLIC PANEL WITH GFR
ALT: 25 U/L (ref 0–44)
AST: 23 U/L (ref 15–41)
Albumin: 3.8 g/dL (ref 3.5–5.0)
Alkaline Phosphatase: 48 U/L (ref 38–126)
Anion gap: 13 (ref 5–15)
BUN: 13 mg/dL (ref 6–20)
CO2: 20 mmol/L — ABNORMAL LOW (ref 22–32)
Calcium: 8.6 mg/dL — ABNORMAL LOW (ref 8.9–10.3)
Chloride: 102 mmol/L (ref 98–111)
Creatinine, Ser: 0.81 mg/dL (ref 0.61–1.24)
GFR, Estimated: >60 mL/min (ref >60)
Glucose, Bld: 148 mg/dL — ABNORMAL HIGH (ref 70–99)
Potassium: 3.5 mmol/L (ref 3.5–5.1)
Sodium: 135 mmol/L (ref 135–145)
Total Bilirubin: 0.7 mg/dL (ref 0.0–1.2)
Total Protein: 6.8 g/dL (ref 6.5–8.1)

## 2023-07-16 LAB — CG4 I-STAT (LACTIC ACID): Lactic Acid, Venous: 1.5 mmol/L (ref 0.5–1.9)

## 2023-07-16 LAB — LACTIC ACID, PLASMA: Lactic Acid, Venous: 2.2 mmol/L (ref 0.5–1.9)

## 2023-07-16 LAB — TROPONIN I (HIGH SENSITIVITY)
Troponin I (High Sensitivity): 84 ng/L — ABNORMAL HIGH (ref <18)
Troponin I (High Sensitivity): 12624 ng/L (ref <18)

## 2023-07-16 LAB — MRSA NEXT GEN BY PCR, NASAL: MRSA by PCR Next Gen: NOT DETECTED

## 2023-07-16 LAB — HEMOGLOBIN A1C
Hgb A1c MFr Bld: 4.9 % (ref 4.8–5.6)
Mean Plasma Glucose: 93.93 mg/dL

## 2023-07-16 LAB — APTT: aPTT: >200 s (ref 24–36)

## 2023-07-16 SURGERY — LEFT HEART CATH AND CORONARY ANGIOGRAPHY
Anesthesia: LOCAL

## 2023-07-16 MED ORDER — SODIUM CHLORIDE 0.9 % IV SOLN: 250.0000 mL | INTRAVENOUS | Status: DC | PRN

## 2023-07-16 MED ORDER — ASPIRIN 81 MG PO CHEW
81.0000 mg | CHEWABLE_TABLET | Freq: Every day | ORAL | Status: DC
  Administered 2023-07-17: 81 mg via ORAL
  Filled 2023-07-16: qty 1

## 2023-07-16 MED ORDER — ATORVASTATIN CALCIUM 80 MG PO TABS
80.0000 mg | ORAL_TABLET | Freq: Every day | ORAL | Status: DC
  Administered 2023-07-16 – 2023-07-17 (×2): 80 mg via ORAL
  Filled 2023-07-16 (×2): qty 1

## 2023-07-16 MED ORDER — SODIUM CHLORIDE 0.9% FLUSH: 3.0000 mL | INTRAVENOUS | Status: DC | PRN

## 2023-07-16 MED ORDER — HYDRALAZINE HCL 20 MG/ML IJ SOLN
INTRAMUSCULAR | Status: AC
  Filled 2023-07-16: qty 1

## 2023-07-16 MED ORDER — FENTANYL CITRATE (PF) 100 MCG/2ML IJ SOLN
INTRAMUSCULAR | Status: DC | PRN
  Administered 2023-07-16: 25 ug via INTRAVENOUS

## 2023-07-16 MED ORDER — TIROFIBAN HCL IN NACL 5-0.9 MG/100ML-% IV SOLN: 0.1500 ug/kg/min | INTRAVENOUS | Status: AC

## 2023-07-16 MED ORDER — VERAPAMIL HCL 2.5 MG/ML IV SOLN
INTRAVENOUS | Status: AC
  Filled 2023-07-16: qty 2

## 2023-07-16 MED ORDER — SERTRALINE HCL 50 MG PO TABS
150.0000 mg | ORAL_TABLET | Freq: Every day | ORAL | Status: DC
  Administered 2023-07-17: 150 mg via ORAL
  Filled 2023-07-16: qty 1

## 2023-07-16 MED ORDER — HEPARIN SODIUM (PORCINE) 1000 UNIT/ML IJ SOLN
INTRAMUSCULAR | Status: DC | PRN
  Administered 2023-07-16: 10000 [IU] via INTRAVENOUS
  Administered 2023-07-16: 4000 [IU] via INTRAVENOUS

## 2023-07-16 MED ORDER — TIROFIBAN HCL IN NACL 5-0.9 MG/100ML-% IV SOLN
INTRAVENOUS | Status: AC | PRN
  Administered 2023-07-16: .15 ug/kg/min via INTRAVENOUS

## 2023-07-16 MED ORDER — TICAGRELOR 90 MG PO TABS
90.0000 mg | ORAL_TABLET | Freq: Two times a day (BID) | ORAL | Status: DC
  Administered 2023-07-16: 90 mg via ORAL
  Filled 2023-07-16: qty 1

## 2023-07-16 MED ORDER — VERAPAMIL HCL 2.5 MG/ML IV SOLN
INTRAVENOUS | Status: DC | PRN
  Administered 2023-07-16: 10 mL via INTRA_ARTERIAL

## 2023-07-16 MED ORDER — TIROFIBAN (AGGRASTAT) BOLUS VIA INFUSION
INTRAVENOUS | Status: DC | PRN
  Administered 2023-07-16: 2475 ug via INTRAVENOUS

## 2023-07-16 MED ORDER — LABETALOL HCL 5 MG/ML IV SOLN: 10.0000 mg | INTRAVENOUS | Status: AC | PRN

## 2023-07-16 MED ORDER — SODIUM CHLORIDE 0.9 % IV SOLN: INTRAVENOUS | Status: AC

## 2023-07-16 MED ORDER — LIDOCAINE HCL (PF) 1 % IJ SOLN
INTRAMUSCULAR | Status: AC
Start: 2023-07-16 — End: ?
  Filled 2023-07-16: qty 30

## 2023-07-16 MED ORDER — IOHEXOL 350 MG/ML SOLN
INTRAVENOUS | Status: DC | PRN
  Administered 2023-07-16: 170 mL

## 2023-07-16 MED ORDER — OXYCODONE HCL 5 MG PO TABS
5.0000 mg | ORAL_TABLET | ORAL | Status: DC | PRN
Start: 2023-07-16 — End: 2023-07-17

## 2023-07-16 MED ORDER — MIDAZOLAM HCL 2 MG/2ML IJ SOLN
INTRAMUSCULAR | Status: DC | PRN
  Administered 2023-07-16: 1 mg via INTRAVENOUS

## 2023-07-16 MED ORDER — ONDANSETRON HCL 4 MG/2ML IJ SOLN: 4.0000 mg | Freq: Four times a day (QID) | INTRAMUSCULAR | Status: DC | PRN

## 2023-07-16 MED ORDER — ACETAMINOPHEN 325 MG PO TABS
650.0000 mg | ORAL_TABLET | ORAL | Status: DC | PRN
  Administered 2023-07-16: 650 mg via ORAL
  Filled 2023-07-16: qty 2

## 2023-07-16 MED ORDER — HYDRALAZINE HCL 20 MG/ML IJ SOLN
10.0000 mg | INTRAMUSCULAR | Status: AC | PRN
  Administered 2023-07-16 (×2): 10 mg via INTRAVENOUS
  Filled 2023-07-16: qty 1

## 2023-07-16 MED ORDER — HEPARIN (PORCINE) IN NACL 2000-0.9 UNIT/L-% IV SOLN
INTRAVENOUS | Status: DC | PRN
  Administered 2023-07-16: 2000 mL

## 2023-07-16 MED ORDER — SODIUM CHLORIDE 0.9% FLUSH
3.0000 mL | Freq: Two times a day (BID) | INTRAVENOUS | Status: DC
  Administered 2023-07-16 (×2): 3 mL via INTRAVENOUS

## 2023-07-16 MED ORDER — TICAGRELOR 90 MG PO TABS
ORAL_TABLET | ORAL | Status: AC
  Filled 2023-07-16: qty 2

## 2023-07-16 MED ORDER — TICAGRELOR 90 MG PO TABS
ORAL_TABLET | ORAL | Status: DC | PRN
  Administered 2023-07-16: 180 mg via ORAL

## 2023-07-16 MED ORDER — FENTANYL CITRATE (PF) 100 MCG/2ML IJ SOLN
INTRAMUSCULAR | Status: AC
  Filled 2023-07-16: qty 2

## 2023-07-16 MED ORDER — LIDOCAINE HCL (PF) 1 % IJ SOLN
INTRAMUSCULAR | Status: DC | PRN
  Administered 2023-07-16: 2 mL

## 2023-07-16 MED ORDER — ORAL CARE MOUTH RINSE: 15.0000 mL | OROMUCOSAL | Status: DC | PRN

## 2023-07-16 MED ORDER — MIDAZOLAM HCL 2 MG/2ML IJ SOLN
INTRAMUSCULAR | Status: AC
  Filled 2023-07-16: qty 2

## 2023-07-16 MED ORDER — TIROFIBAN HCL IN NACL 5-0.9 MG/100ML-% IV SOLN
INTRAVENOUS | Status: AC
  Filled 2023-07-16: qty 100

## 2023-07-16 SURGICAL SUPPLY — 12 items
BALLOON SAPPHIRE 2.0X15 (BALLOONS) IMPLANT
BALLOON ~~LOC~~ EMERGE MR 3.25X15 (BALLOONS) IMPLANT
CATH 5FR JL3.5 JR4 ANG PIG MP (CATHETERS) IMPLANT
CATH VISTA GUIDE 6FR XBLD 3.5 (CATHETERS) IMPLANT
DEVICE RAD COMP TR BAND LRG (VASCULAR PRODUCTS) IMPLANT
ELECT DEFIB PAD ADLT CADENCE (PAD) IMPLANT
GLIDESHEATH SLEND SS 6F .021 (SHEATH) IMPLANT
GUIDEWIRE INQWIRE 1.5J.035X260 (WIRE) IMPLANT
KIT ENCORE 26 ADVANTAGE (KITS) IMPLANT
PACK CARDIAC CATHETERIZATION (CUSTOM PROCEDURE TRAY) ×1 IMPLANT
STENT SYNERGY XD 3.0X20 (Permanent Stent) IMPLANT
WIRE ASAHI PROWATER 180CM (WIRE) IMPLANT

## 2023-07-16 NOTE — Progress Notes (Signed)
 VSS. Patient denies any distress. Final 3cc air removed from TR band. No bleeding or hematoma noted. Post activity and precautions explained. Patient denies any distress at this time. Patient transported to 2H02; care released.Taytum Scheck E

## 2023-07-16 NOTE — H&P (Signed)
 Cardiology Admission History and Physical   Patient ID: Jose Reyes MRN: [id number]; DOB: 11/07/1975   Admission date: 07/16/2023  PCP:  Aida House, MD    HeartCare Providers Cardiologist:  None   {   Chief Complaint:  Chest pain   History of Present Illness:   Jose Reyes  is a 48 yo male with history of GERD and HLD who presented to the ED via EMS with chest pain. EKG with inferolateral ST elevation. Code STEMI called by EMS. Chest pain began at 9am. Pt with no chest pain on arrival to [facility] but with ongoing inferolateral ST elevation   Past Medical History:  Diagnosis Date   Reflux    Testicular cancer (HCC) 1997   treated with surgery x 2, chemo.     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 Authorizing Provider  EPINEPHrine  (EPIPEN  2-PAK) 0.3 mg/0.3 mL IJ SOAJ injection Inject 0.3 mLs (0.3 mg total) into the muscle as needed for anaphylaxis. 05/14/18   Koberlein, Junell C, MD  imipramine  (TOFRANIL ) 10 MG tablet TAKE 1 TABLET BY MOUTH EVERYDAY AT BEDTIME 04/22/23   Aida House, MD  sertraline  (ZOLOFT ) 100 MG tablet TAKE 1.5 TABLETS (150 MG TOTAL) BY MOUTH DAILY 01/14/23   Aida House, MD     Allergies:    Allergies  Allergen Reactions   Penicillins Hives    REACTION: unknown REACTION: unknown    Bee Venom     Social History:   Social History   Socioeconomic History   Marital status: Single    Spouse name: Not on file   Number of children: Not on file   Years of education: Not on file   Highest education level: Not on file  Occupational History   Not on file  Tobacco Use   Smoking status: Never    Smokeless tobacco: Never  Substance and Sexual Activity   Alcohol use: Yes    Alcohol/week: 10.0 standard drinks of alcohol    Types: 10 Cans of beer per week    Comment: occ   Drug use: No   Sexual activity: Yes  Other Topics Concern   Not on file  Social History Narrative   Not on file   Social Drivers of Health   Financial Resource Strain: Not on file  Food Insecurity: Not on file  Transportation Needs: Not on file  Physical Activity: Sufficiently Active (10/15/2022)   Exercise Vital Sign    Days of Exercise per Week: 5 days    Minutes of Exercise per Session: 60 min  Stress: Not on file  Social Connections: Not on file  Intimate Partner Violence: Not on file    Family History:   The patient's family history includes CAD in his maternal grandmother; Cancer in his maternal grandfather; Esophageal cancer in his father; Heart attack (age of onset: 66) in his paternal grandfather; Melanoma (age of onset: 53) in his paternal grandmother; Stomach cancer in his father.    ROS:  Please see the history of present illness.  All other ROS reviewed and negative.     Physical Exam/Data:  Vitals:   07/16/23 1116 07/16/23 1121 07/16/23 1126 07/16/23 1131  BP: (!) 173/86 (!) 158/92 (!) 156/95 (!) 160/105  Pulse: 82 82 76 79  Resp: 17 18 (!) 22 17  SpO2: 98% (!) 86% 100% (!) 87%   No intake or output data in the 24 hours ending 07/16/23 1158    05/20/2023   11:25 AM 02/28/2023    3:23 PM 10/15/2022   10:46 AM  Last 3 Weights  Weight (lbs) 227 lb 4.8 oz 226 lb 6.4 oz 224 lb 6.4 oz  Weight (kg) 103.103 kg 102.694 kg 101.787 kg     There is no height or weight on file to calculate BMI.  General:  Well nourished, well developed, in no acute distress HEENT: normal Neck: no JVD Vascular: No carotid bruits; Distal pulses 2+ bilaterally   Cardiac:  normal S1, S2; RRR; no murmur  Lungs:  clear to auscultation bilaterally, no wheezing, rhonchi or rales  Abd: soft, nontender, no  hepatomegaly  Ext: no LE edema Musculoskeletal:  No deformities, BUE and BLE strength normal and equal Skin: warm and dry  Neuro:  CNs 2-12 intact, no focal abnormalities noted Psych:  Normal affect    EKG:  The ECG that was done was personally reviewed and demonstrates sinus with 2 mm ST elevation inferior and anterolateral leads  Relevant CV Studies:   Laboratory Data:  High Sensitivity Troponin:  No results for input(s): "TROPONINIHS" in the last 720 hours.    ChemistryNo results for input(s): "NA", "K", "CL", "CO2", "GLUCOSE", "BUN", "CREATININE", "CALCIUM", "MG", "GFRNONAA", "GFRAA", "ANIONGAP" in the last 168 hours.  No results for input(s): "PROT", "ALBUMIN", "AST", "ALT", "ALKPHOS", "BILITOT" in the last 168 hours. Lipids No results for input(s): "CHOL", "TRIG", "HDL", "LABVLDL", "LDLCALC", "CHOLHDL" in the last 168 hours. HematologyNo results for input(s): "WBC", "RBC", "HGB", "HCT", "MCV", "MCH", "MCHC", "RDW", "PLT" in the last 168 hours. Thyroid No results for input(s): "TSH", "FREET4" in the last 168 hours. BNPNo results for input(s): "BNP", "PROBNP" in the last 168 hours.  DDimer No results for input(s): "DDIMER" in the last 168 hours.   Radiology/Studies:  CARDIAC CATHETERIZATION Result Date: 07/16/2023   Mid RCA lesion is 20% stenosed.   Mid Cx lesion is 99% stenosed.   1st Diag lesion is 40% stenosed.   Prox LAD to Mid LAD lesion is 20% stenosed.   A drug-eluting stent was successfully placed using a STENT SYNERGY XD 3.0X20.   Post intervention, there is a 0% residual stenosis. Acute inferolateral STEMI secondary to severe stenosis in the mid Circumflex Successful PTCA/DES x 1 mid Circumflex Mild non-obstructive disease in the mid LAD, diagonal and RCA Normal LV systolic function Recommendations: Monitor overnight in ICU. DAPT for one year with ASA and Brilinta. High intensity statin. Echo today. Aggrastat 2 hours post PCI     Assessment and Plan:   Acute  inferolateral STEMI: Plan emergent cardiac cath   Code Status: Full Code  Severity of Illness: The appropriate patient status for this patient is INPATIENT. Inpatient status is judged to be reasonable and necessary in order to provide the required intensity of service to ensure the patient's safety. The patient's presenting symptoms, physical exam findings, and initial radiographic and laboratory data in the context of their chronic comorbidities is felt to place them at high risk for further clinical deterioration. Furthermore, it is not anticipated that the patient will be medically stable for discharge from the hospital within 2 midnights of admission.   *  I certify that at the point of admission it is my clinical judgment that the patient will require inpatient hospital care spanning beyond 2 midnights from the point of admission due to high intensity of service, high risk for further deterioration and high frequency of surveillance required.*   For questions or updates, please contact [facility] Please consult [email or web address] for contact info under     Signed, Antoinette Batman, MD  07/16/2023 11:58 AM

## 2023-07-16 NOTE — Telephone Encounter (Signed)
 Patient Product/process development scientist completed.    The patient is insured through [employer]. Patient has [employer], may use a copay card, and/or apply for patient assistance if available.    Ran test claim for Brilinta 90 mg and the current 30 day co-pay is $448.25.   This test claim was processed through [facility]- copay amounts may vary at other pharmacies due to pharmacy/plan contracts, or as the patient moves through the different stages of their insurance plan.     Morgan Arab, CPHT Pharmacy Technician III Certified Patient Advocate [facility] Patient Advocate Team Direct Number: [phone]  Fax: [phone]

## 2023-07-17 ENCOUNTER — Other Ambulatory Visit (HOSPITAL_COMMUNITY): Payer: Self-pay

## 2023-07-17 ENCOUNTER — Encounter (HOSPITAL_COMMUNITY): Payer: Self-pay | Admitting: Cardiovascular Disease

## 2023-07-17 ENCOUNTER — Inpatient Hospital Stay (HOSPITAL_COMMUNITY)

## 2023-07-17 ENCOUNTER — Telehealth (HOSPITAL_COMMUNITY): Payer: Self-pay | Admitting: Pharmacy Technician

## 2023-07-17 DIAGNOSIS — I10 Essential (primary) hypertension: Secondary | ICD-10-CM

## 2023-07-17 DIAGNOSIS — I2510 Atherosclerotic heart disease of native coronary artery without angina pectoris: Secondary | ICD-10-CM

## 2023-07-17 DIAGNOSIS — I2119 ST elevation (STEMI) myocardial infarction involving other coronary artery of inferior wall: Principal | ICD-10-CM

## 2023-07-17 DIAGNOSIS — E785 Hyperlipidemia, unspecified: Secondary | ICD-10-CM | POA: Insufficient documentation

## 2023-07-17 DIAGNOSIS — E7800 Pure hypercholesterolemia, unspecified: Secondary | ICD-10-CM

## 2023-07-17 DIAGNOSIS — Z955 Presence of coronary angioplasty implant and graft: Secondary | ICD-10-CM

## 2023-07-17 LAB — ECHOCARDIOGRAM COMPLETE
Area-P 1/2: 2.76 cm2
S' Lateral: 3.3 cm
Single Plane A4C EF: 60.5 %
Weight: 3488 [oz_av]

## 2023-07-17 LAB — CBC
HCT: 44.3 % (ref 39.0–52.0)
Hemoglobin: 15.4 g/dL (ref 13.0–17.0)
MCH: 31.5 pg (ref 26.0–34.0)
MCHC: 34.8 g/dL (ref 30.0–36.0)
MCV: 90.6 fL (ref 80.0–100.0)
Platelets: 190 10*3/uL (ref 150–400)
RBC: 4.89 MIL/uL (ref 4.22–5.81)
RDW: 13 % (ref 11.5–15.5)
WBC: 9.3 10*3/uL (ref 4.0–10.5)
nRBC: 0 % (ref 0.0–0.2)

## 2023-07-17 LAB — BASIC METABOLIC PANEL WITH GFR
Anion gap: 10 (ref 5–15)
BUN: 11 mg/dL (ref 6–20)
CO2: 21 mmol/L — ABNORMAL LOW (ref 22–32)
Calcium: 9.3 mg/dL (ref 8.9–10.3)
Chloride: 107 mmol/L (ref 98–111)
Creatinine, Ser: 0.9 mg/dL (ref 0.61–1.24)
GFR, Estimated: >60 mL/min (ref >60)
Glucose, Bld: 110 mg/dL — ABNORMAL HIGH (ref 70–99)
Potassium: 3.9 mmol/L (ref 3.5–5.1)
Sodium: 138 mmol/L (ref 135–145)

## 2023-07-17 MED ORDER — PRASUGREL HCL 10 MG PO TABS
60.0000 mg | ORAL_TABLET | Freq: Once | ORAL | Status: AC
  Administered 2023-07-17: 60 mg via ORAL
  Filled 2023-07-17: qty 6

## 2023-07-17 MED ORDER — ASPIRIN 81 MG PO CHEW
81.0000 mg | CHEWABLE_TABLET | Freq: Every day | ORAL | 3 refills | Status: AC
  Filled 2023-07-17: qty 90, 90d supply, fill #0

## 2023-07-17 MED ORDER — CHLORHEXIDINE GLUCONATE CLOTH 2 % EX PADS: 6.0000 | MEDICATED_PAD | Freq: Every day | CUTANEOUS | Status: DC

## 2023-07-17 MED ORDER — METOPROLOL SUCCINATE ER 25 MG PO TB24
25.0000 mg | ORAL_TABLET | Freq: Every day | ORAL | 3 refills | Status: DC
  Filled 2023-07-17: qty 30, 30d supply, fill #0

## 2023-07-17 MED ORDER — ATORVASTATIN CALCIUM 80 MG PO TABS
80.0000 mg | ORAL_TABLET | Freq: Every day | ORAL | 3 refills | Status: DC
  Filled 2023-07-17: qty 30, 30d supply, fill #0

## 2023-07-17 MED ORDER — METOPROLOL SUCCINATE ER 25 MG PO TB24
25.0000 mg | ORAL_TABLET | Freq: Every day | ORAL | Status: DC
  Administered 2023-07-17: 25 mg via ORAL
  Filled 2023-07-17: qty 1

## 2023-07-17 MED ORDER — PRASUGREL HCL 10 MG PO TABS: 10.0000 mg | ORAL_TABLET | Freq: Every day | ORAL | Status: DC

## 2023-07-17 MED ORDER — PRASUGREL HCL 10 MG PO TABS
10.0000 mg | ORAL_TABLET | Freq: Every day | ORAL | 3 refills | Status: DC
  Filled 2023-07-17: qty 30, 30d supply, fill #0

## 2023-07-17 NOTE — Discharge Instructions (Signed)
 Information about your medication: Effient (anti-platelet agent)  Generic Name (Brand): prasugrel (Effient), once daily medication  PURPOSE: You are taking this medication along with aspirin to lower your chance of having a heart attack, stroke, or blood clots in your heart stent. These can be fatal. Prasugrel and aspirin help prevent platelets from sticking together and forming a clot that can block an artery or your stent.   Common SIDE EFFECTS you may experience include: bruising or bleeding more easily, shortness of breath  Do not stop taking EFFIENT without talking to the doctor who prescribes it for you. People who are treated with a stent and stop taking Effient too soon, have a higher risk of getting a blood clot in the stent, having a heart attack, or dying. If you stop Effient because of bleeding, or for other reasons, your risk of a heart attack or stroke may increase.   Tell all of your doctors and dentists that you are taking Effient. They should talk to the doctor who prescribed Brilinta for you before you have any surgery or invasive procedure.   Contact your health care provider if you experience: severe or uncontrollable bleeding, pink/red/brown urine, vomiting blood or vomit that looks like "coffee grounds", red or black stools (looks like tar), coughing up blood or blood clots ----------------------------------------------------------------------------------------------------------------------  Information about your medication:  Beta Blocker (Heart rate/Blood Pressure-lowering agent)  Generic Name (Brand): metoprolol succinate (Toprol XL),  PURPOSE: You are taking this medication to lower blood pressure and heart rate to help prevent further damage to your heart and to reduce your risk of death following your cardiac event.   Common SIDE EFFECTS you may experience include: fatigue, dizziness, diarrhea, or nausea. This medication may also mask the signs of hypoglycemia  Take  your medication exactly as prescribed.   Contact your health care provider if you experience: severely lower bllod pressure, syncope, palpitations, or chest pain muscle ----------------------------------------------------------------------------------------------------------------------  Information about your medication: Statin (cholesterol-lowering agent)  Generic Name (Brand): atorvastatin (Lipitor)  PURPOSE: You are taking this medication to lower your "bad" cholesterol (LDL) and to prevent heart attacks and strokes. Statins can also raise your "good" cholesterol (HDL).  Common SIDE EFFECTS you may experience include: muscle pain or weakness (especially in the legs) and upset stomach.  Take your medication exactly as prescribed. Do not eat large amounts of grapefruit or grapefruit juice while taking this medication.  Contact your health care provider if you experience: severe muscle pain that does not improve, dark urine, or yellowing of your skin or eyes. ----------------------------------------------------------------------------------------------------------------------

## 2023-07-17 NOTE — Telephone Encounter (Signed)
 Patient Product/process development scientist completed.    The patient is insured through [employer]. Patient has [employer], may use a copay card, and/or apply for patient assistance if available.    Ran test claim for prasugrel (Effient) 10 mg and the current 30 day co-pay is $4.52.   This test claim was processed through [facility]- copay amounts may vary at other pharmacies due to pharmacy/plan contracts, or as the patient moves through the different stages of their insurance plan.     Morgan Arab, CPHT Pharmacy Technician III Certified Patient Advocate [facility] Patient Advocate Team Direct Number: [phone]  Fax: [phone]

## 2023-07-17 NOTE — TOC Initial Note (Addendum)
 Transition of Care [facility]) - Initial/Assessment Note    Patient Details  Name: Jose Reyes MRN: [id number] Date of Birth: Mar 02, 1976  [facility]) CM/SW Contact:    Benjiman Bras, RN Phone Number: [phone] 07/17/2023, 7:41 AM  Clinical Narrative:                 TOC CM spoke to pt at bedside. Gave permission to speak to wife. Pt states he was independent pta. Did not need a note for work. Waiting benefits check for Brillinta.   Ran test claim for Brilinta 90 mg and the current 30 day co-pay is $448.25.   Will get 30 day free with trial offer and will need copay card.    Expected Discharge Plan: Home/Self Care Barriers to Discharge: No Barriers Identified   Patient Goals and CMS Choice Patient states their goals for this hospitalization and ongoing recovery are:: wants to remain independent          Expected Discharge Plan and Services   Discharge Planning Services: CM Consult   Living arrangements for the past 2 months: Single Family Home                                      Prior Living Arrangements/Services Living arrangements for the past 2 months: Single Family Home Lives with:: Spouse Patient language and need for interpreter reviewed:: Yes Do you feel safe going back to the place where you live?: Yes      Need for Family Participation in Patient Care: No (Comment) Care giver support system in place?: No (comment)   Criminal Activity/Legal Involvement Pertinent to Current Situation/Hospitalization: No - Comment as needed  Activities of Daily Living   ADL Screening (condition at time of admission) Independently performs ADLs?: Yes (appropriate for developmental age) Is the patient deaf or have difficulty hearing?: No Does the patient have difficulty seeing, even when wearing glasses/contacts?: No Does the patient have difficulty concentrating, remembering, or making decisions?: No  Permission Sought/Granted Permission sought  to share information with : Case Manager, Family Supports, PCP Permission granted to share information with : Yes, Verbal Permission Granted        Permission granted to share info w Relationship: wife     Emotional Assessment Appearance:: Appears stated age Attitude/Demeanor/Rapport: Engaged Affect (typically observed): Accepting Orientation: : Oriented to Self, Oriented to Place, Oriented to  Time, Oriented to Situation   Psych Involvement: No (comment)  Admission diagnosis:  ST elevation myocardial infarction (STEMI) of inferolateral wall (HCC) [I21.19] Patient Active Problem List   Diagnosis Date Noted   ST elevation myocardial infarction (STEMI) of inferolateral wall (HCC) 07/16/2023   Hordeolum externum of left eye 11/28/2021   Nocturnal enuresis 05/14/2018   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 AT [address]NER OF [facility] [address] [place] [address] Phone: [phone] Fax: [phone]     Social Drivers of Health (SDOH) Social History: SDOH Screenings   Food Insecurity: No Food Insecurity (07/16/2023)  Housing: Low Risk  (07/16/2023)  Transportation Needs: No Transportation Needs (07/16/2023)  Utilities: Not At Risk (07/16/2023)  Alcohol Screen: Low Risk  (10/15/2022)  Depression (PHQ2-9): Low Risk  (10/15/2022)  Physical Activity: Sufficiently Active (10/15/2022)  Tobacco Use: Low  Risk  (07/16/2023)   SDOH Interventions:     Readmission Risk Interventions     No data to display

## 2023-07-17 NOTE — Progress Notes (Signed)
 Rounding Note    Patient Name: Jose Reyes Date of Encounter: 07/17/2023  [facility] Cardiologist: New  Subjective   No chest pain  Inpatient Medications    Scheduled Meds:  aspirin  81 mg Oral Daily   atorvastatin  80 mg Oral Daily   Chlorhexidine Gluconate Cloth  6 each Topical Daily   sertraline   150 mg Oral Daily   sodium chloride  flush  3 mL Intravenous Q12H   ticagrelor  90 mg Oral BID   Continuous Infusions:  sodium chloride      PRN Meds: sodium chloride , acetaminophen, ondansetron (ZOFRAN) IV, mouth rinse, oxyCODONE, sodium chloride  flush   Vital Signs    Vitals:   07/17/23 0450 07/17/23 0500 07/17/23 0600 07/17/23 0700  BP:  (!) 143/103 (!) 134/90 135/88  Pulse:  81 74 72  Resp:  15 (!) 24 17  Temp: 97.8 F (36.6 C)     TempSrc: Oral     SpO2:  98% 97% 98%  Weight:        Intake/Output Summary (Last 24 hours) at 07/17/2023 0803 Last data filed at 07/17/2023 0500 Gross per 24 hour  Intake 873.48 ml  Output 2800 ml  Net -1926.52 ml      07/16/2023   12:00 PM 05/20/2023   11:25 AM 02/28/2023    3:23 PM  Last 3 Weights  Weight (lbs) 218 lb 227 lb 4.8 oz 226 lb 6.4 oz  Weight (kg) 98.884 kg 103.103 kg 102.694 kg      Telemetry    Sinus, PVCs - Personally Reviewed  ECG    Sinus, PVCs, inferior Q waves - Personally Reviewed  Physical Exam   GEN: No acute distress.   Neck: No JVD Cardiac: RRR, no murmurs, rubs, or gallops.  Respiratory: Clear to auscultation bilaterally. GI: Soft, nontender, non-distended  MS: No edema; No deformity. Neuro:  Nonfocal  Psych: Normal affect   Labs    High Sensitivity Troponin:   Recent Labs  Lab 07/16/23 1100 07/16/23 1449  TROPONINIHS 84* 12,624*     Chemistry Recent Labs  Lab 07/16/23 1100 07/16/23 1109 07/17/23 0302  NA 135 136 138  K 3.5 3.6 3.9  CL 102 105 107  CO2 20*  --  21*  GLUCOSE 148* 155* 110*  BUN 13 13 11   CREATININE 0.81 0.80 0.90  CALCIUM 8.6*  --  9.3  PROT  6.8  --   --   ALBUMIN 3.8  --   --   AST 23  --   --   ALT 25  --   --   ALKPHOS 48  --   --   BILITOT 0.7  --   --   GFRNONAA >60  --  >60  ANIONGAP 13  --  10    Lipids  Recent Labs  Lab 07/16/23 1100  CHOL 238*  TRIG 61  HDL 49  LDLCALC 177*  CHOLHDL 4.9    Hematology Recent Labs  Lab 07/16/23 1100 07/16/23 1109 07/17/23 0302  WBC 8.1  --  9.3  RBC 4.59  --  4.89  HGB 14.5 13.9 15.4  HCT 40.9 41.0 44.3  MCV 89.1  --  90.6  MCH 31.6  --  31.5  MCHC 35.5  --  34.8  RDW 12.6  --  13.0  PLT 170  --  190   Thyroid No results for input(s): "TSH", "FREET4" in the last 168 hours.  BNPNo results for input(s): "BNP", "PROBNP"  in the last 168 hours.  DDimer No results for input(s): "DDIMER" in the last 168 hours.   Radiology    CARDIAC CATHETERIZATION Result Date: 07/16/2023   Mid RCA lesion is 20% stenosed.   Mid Cx lesion is 99% stenosed.   1st Diag lesion is 40% stenosed.   Prox LAD to Mid LAD lesion is 20% stenosed.   A drug-eluting stent was successfully placed using a STENT SYNERGY XD 3.0X20.   Post intervention, there is a 0% residual stenosis. Acute inferolateral STEMI secondary to severe stenosis in the mid Circumflex Successful PTCA/DES x 1 mid Circumflex Mild non-obstructive disease in the mid LAD, diagonal and RCA Normal LV systolic function Recommendations: Monitor overnight in ICU. DAPT for one year with ASA and Brilinta. High intensity statin. Echo today. Aggrastat 2 hours post PCI    Cardiac Studies   See above  Patient Profile     48 y.o. male with HTN, HLD admitted with inferolateral STEMI. Cardiac cath with severe mid Circumflex stenosis treated with a drug eluting stent.   Assessment & Plan    CAD/Inferolateral STEMI: No chest pain this am. Will continue ASA. Will change Brilinta to Effient today due to cost. Will stop Brilinta and load with with Effient 60 mg po x 1. Continue high intensity statin. Will start low dose Toprol given MI and PVCs. Echo  pending today.   HTN: BP elevated. Will start Toprol.  HLD: High intensity statin. Goal LDL under 55. If we can't get his LDL under 55, will need Repatha.   Echo pending. Will d/c home after echo.  For questions or updates, please contact [facility] Please consult [email or web address] for contact info under     Signed, Antoinette Batman, MD  07/17/2023, 8:03 AM

## 2023-07-17 NOTE — Progress Notes (Signed)
  Echocardiogram 2D Echocardiogram has been performed.  Fain Home RDCS 07/17/2023, 12:09 PM

## 2023-07-17 NOTE — Plan of Care (Signed)
  Problem: Activity: Goal: Risk for activity intolerance will decrease Outcome: Progressing   Problem: Pain Managment: Goal: General experience of comfort will improve and/or be controlled Outcome: Progressing   Problem: Activity: Goal: Ability to return to baseline activity level will improve Outcome: Progressing   Problem: Cardiovascular: Goal: Ability to achieve and maintain adequate cardiovascular perfusion will improve Outcome: Progressing Goal: Vascular access site(s) Level 0-1 will be maintained Outcome: Progressing   Problem: Nutrition: Goal: Adequate nutrition will be maintained Outcome: Adequate for Discharge   Problem: Coping: Goal: Level of anxiety will decrease Outcome: Adequate for Discharge   Problem: Elimination: Goal: Will not experience complications related to urinary retention Outcome: Adequate for Discharge   Problem: Safety: Goal: Ability to remain free from injury will improve Outcome: Adequate for Discharge

## 2023-07-17 NOTE — Progress Notes (Signed)
 CARDIAC REHAB PHASE I      Post MI/stent education including restrictions, risk factors, exercise guidelines, antiplatelet therapy importance, MI booklet, NTG use, heart healthy diet  and CRP2 reviewed. All questions and concerns addressed. Will refer to [facility] for CRP2. Echo tech arrived for testing prior to walk, will ask ICU RN to evaluate ambulation tolerance once echo complete. Plan for discharge later today.   1100-1200 Ronny Colas, RN BSN 07/17/2023 11:54 AM

## 2023-07-17 NOTE — Discharge Summary (Addendum)
 Discharge Summary    Patient ID: Jose Reyes MRN: [id number]; DOB: 02/25/1976  Admit date: 07/16/2023 Discharge date: 07/17/2023  PCP:  Aida House, MD   [facility] Providers Cardiologist:  Antoinette Batman, MD   {  Discharge Diagnoses    Principal Problem:   ST elevation myocardial infarction (STEMI) of inferolateral wall [facility]) Active Problems:   CAD (coronary artery disease)   HTN (hypertension)   HLD (hyperlipidemia)   S/P drug eluting coronary stent placement  Diagnostic Studies/Procedures   Cardiac catheterization, 07/16/2023 performed by Dr. Abel Hoe   Mid RCA lesion is 20% stenosed.   Mid Cx lesion is 99% stenosed.   1st Diag lesion is 40% stenosed.   Prox LAD to Mid LAD lesion is 20% stenosed.   A drug-eluting stent was successfully placed using a STENT SYNERGY XD 3.0X20.   Post intervention, there is a 0% residual stenosis.   Acute inferolateral STEMI secondary to severe stenosis in the mid Circumflex Successful PTCA/DES x 1 mid Circumflex Mild non-obstructive disease in the mid LAD, diagonal and RCA Normal LV systolic function   Recommendations: Monitor overnight in ICU. DAPT for one year with ASA and Brilinta. High intensity statin. Echo today. Aggrastat 2 hours post PCI  Echocardiogram, 07/17/2023  Pending formal read. MD preliminarily read and determined normal LV function    History of Present Illness     Jose Reyes is a 48 y.o. male with history of GERD and HLD who presented to the ED via EMS with chest pain. EKG with inferolateral ST elevation. Code STEMI called by EMS. Patient was taken directly to cath lab for emergent cardiac catheterization.  Hospital Course  Patient presented to [facility] via EMS, called code STEMI with STE noted in inferolateral leads. He had chest pain beginning 5/6 at 9AM. He was brought directly into cath lab for emergent cardiac catheterization.   LHC revealed acute inferolateral STEMI  secondary to severe stenosis in the mid Circumflex. Patient underwent successful PTCA/DES x 1 mid Circumflex using SYNERGY XD 3.0 X 20. Also showing mild non-obstructive disease in the mid LAD, diagonal and RCA and normal LV systolic function. Results above.   Recommendation was to monitor overnight in ICU, Aggrastat x 2 hours post PCI. DAPT (ASA + Effient) x 1 year, Lipitor 80 mg daily, Toprol 25 mg daily.   Echocardiogram was completed, pending formal read, but was preliminary read by MD who determined he had normal LV function and was okay to discharge. Follow up appointment has been arranged.     Did the patient have an acute coronary syndrome (MI, NSTEMI, STEMI, etc) this admission?:  Yes                               AHA/ACC ACS Clinical Performance & Quality Measures: Aspirin prescribed? - Yes ADP Receptor Inhibitor (Plavix/Clopidogrel, Brilinta/Ticagrelor or Effient/Prasugrel) prescribed (includes medically managed patients)? - Yes Beta Blocker prescribed? - Yes High Intensity Statin (Lipitor 40-80mg  or Crestor 20-40mg ) prescribed? - Yes EF assessed during THIS hospitalization? - Yes For EF <40%, was ACEI/ARB prescribed? - Not Applicable (EF >/= 40%) For EF <40%, Aldosterone Antagonist (Spironolactone or Eplerenone) prescribed? - Not Applicable (EF >/= 40%) Cardiac Rehab Phase II ordered (including medically managed patients)? - Yes   ____________  Discharge Vitals Blood pressure (!) 140/80, pulse 90, temperature 97.8 F (36.6 C), temperature source Oral, resp. rate (!) 30, weight 98.9 kg,  SpO2 95%.  Filed Weights   07/16/23 1200  Weight: 98.9 kg   Labs & Radiologic Studies    CBC Recent Labs    07/16/23 1100 07/16/23 1109 07/17/23 0302  WBC 8.1  --  9.3  NEUTROABS 6.4  --   --   HGB 14.5 13.9 15.4  HCT 40.9 41.0 44.3  MCV 89.1  --  90.6  PLT 170  --  190   Basic Metabolic Panel Recent Labs    32/44/01 1100 07/16/23 1109 07/17/23 0302  NA 135 136 138  K 3.5  3.6 3.9  CL 102 105 107  CO2 20*  --  21*  GLUCOSE 148* 155* 110*  BUN 13 13 11   CREATININE 0.81 0.80 0.90  CALCIUM 8.6*  --  9.3   Liver Function Tests Recent Labs    07/16/23 1100  AST 23  ALT 25  ALKPHOS 48  BILITOT 0.7  PROT 6.8  ALBUMIN 3.8   No results for input(s): "LIPASE", "AMYLASE" in the last 72 hours. High Sensitivity Troponin:   Recent Labs  Lab 07/16/23 1100 07/16/23 1449  TROPONINIHS 84* 12,624*    BNP Invalid input(s): "POCBNP" D-Dimer No results for input(s): "DDIMER" in the last 72 hours. Hemoglobin A1C Recent Labs    07/16/23 1100  HGBA1C 4.9   Fasting Lipid Panel Recent Labs    07/16/23 1100  CHOL 238*  HDL 49  LDLCALC 177*  TRIG 61  CHOLHDL 4.9   Thyroid Function Tests No results for input(s): "TSH", "T4TOTAL", "T3FREE", "THYROIDAB" in the last 72 hours.  Invalid input(s): "FREET3" _____________  CARDIAC CATHETERIZATION Result Date: 07/16/2023   Mid RCA lesion is 20% stenosed.   Mid Cx lesion is 99% stenosed.   1st Diag lesion is 40% stenosed.   Prox LAD to Mid LAD lesion is 20% stenosed.   A drug-eluting stent was successfully placed using a STENT SYNERGY XD 3.0X20.   Post intervention, there is a 0% residual stenosis. Acute inferolateral STEMI secondary to severe stenosis in the mid Circumflex Successful PTCA/DES x 1 mid Circumflex Mild non-obstructive disease in the mid LAD, diagonal and RCA Normal LV systolic function Recommendations: Monitor overnight in ICU. DAPT for one year with ASA and Brilinta. High intensity statin. Echo today. Aggrastat 2 hours post PCI   Disposition   Pt is being discharged home today in good condition per MD.  Follow-up Plans & Appointments   Future Appointments  Date Time Provider Department Center  07/26/2023  2:45 PM Jude Norton, NP [facility]    Follow-up Information     Aida House, MD Follow up.   Specialty: Family Medicine Why: please call your PCP and schedule hospital follow up  appt to be seen in 7-14 days Contact information: [address] [place] [facility] [place] [address] [phone]                Discharge Instructions     AMB Referral to [facility] Pharm[facility]   Complete by: As directed    Reason For Referral: Lipids   Amb Referral to Cardiac Rehabilitation   Complete by: As directed    Diagnosis:  STEMI Coronary Stents     After initial evaluation and assessments completed: Virtual Based Care may be provided alone or in conjunction with Phase 2 Cardiac Rehab based on patient barriers.: Yes   Intensive Cardiac Rehabilitation (ICR) [facility] location only OR Traditional Cardiac Rehabilitation (TCR) *If criteria for ICR are not met will enroll in TCR [facility]  only): Yes   Call MD for:  persistant dizziness or light-headedness   Complete by: As directed    Call MD for:  redness, tenderness, or signs of infection (pain, swelling, redness, odor or green/yellow discharge around incision site)   Complete by: As directed    Discharge instructions   Complete by: As directed    PLEASE DO NOT MISS ANY DOSES OF YOUR EFFIENT!!!!! Also keep a log of you blood pressures and bring back to your follow up appt. Please call the office with any questions.   Patients taking blood thinners should generally stay away from medicines like ibuprofen, Advil, Motrin, naproxen, and Aleve due to risk of stomach bleeding. You may take Tylenol as directed or talk to your primary doctor about alternatives.  PLEASE ENSURE THAT YOU DO NOT RUN OUT OF YOUR EFFIENT. This medication is very important to remain on for at least one year. IF you have issues obtaining this medication due to cost please CALL the office 3-5 business days prior to running out in order to prevent missing doses of this medication.   Radial Site Care Refer to this sheet in the next few weeks. These instructions provide you with information on caring for yourself after your procedure. Your caregiver may also give you more  specific instructions. Your treatment has been planned according to current medical practices, but problems sometimes occur. Call your caregiver if you have any problems or questions after your procedure.  HOME CARE INSTRUCTIONS You may shower the day after the procedure. Remove the bandage (dressing) and gently wash the site with plain soap and water. Gently pat the site dry.  Do not apply powder or lotion to the site.  Do not submerge the affected site in water for 3 to 5 days.  Inspect the site at least twice daily.  Do not flex or bend the affected arm for 24 hours.  No lifting over 5 pounds (2.3 kg) for 5 days after your procedure.  Do not drive home if you are discharged the same day of the procedure. Have someone else drive you.  You may drive 24 hours after the procedure unless otherwise instructed by your caregiver.   What to expect: Any bruising will usually fade within 1 to 2 weeks.  Blood that collects in the tissue (hematoma) may be painful to the touch. It should usually decrease in size and tenderness within 1 to 2 weeks.   SEEK IMMEDIATE MEDICAL CARE IF: You have unusual pain at the radial site.  You have redness, warmth, swelling, or pain at the radial site.  You have drainage (other than a small amount of blood on the dressing).  You have chills.  You have a fever or persistent symptoms for more than 72 hours.  You have a fever and your symptoms suddenly get worse.  Your arm becomes pale, cool, tingly, or numb.  You have heavy bleeding from the site. Hold pressure on the site.      Discharge Medications   Allergies as of 07/17/2023       Reactions   Amoxicillin Hives   Penicillins Hives   Bee Venom         Medication List     TAKE these medications    aspirin 81 MG chewable tablet Chew 1 tablet (81 mg total) by mouth daily. Start taking on: Jul 18, 2023   atorvastatin 80 MG tablet Commonly known as: LIPITOR Take 1 tablet (80 mg total) by mouth  daily.  Start taking on: Jul 18, 2023   EPINEPHrine  0.3 mg/0.3 mL Soaj injection Commonly known as: EpiPen  2-Pak Inject 0.3 mLs (0.3 mg total) into the muscle as needed for anaphylaxis.   metoprolol succinate 25 MG 24 hr tablet Commonly known as: TOPROL-XL Take 1 tablet (25 mg total) by mouth daily. Start taking on: Jul 18, 2023   prasugrel 10 MG Tabs tablet Commonly known as: EFFIENT Take 1 tablet (10 mg total) by mouth daily. Start taking on: Jul 18, 2023   sertraline  100 MG tablet Commonly known as: ZOLOFT  TAKE 1.5 TABLETS (150 MG TOTAL) BY MOUTH DAILY        Duration of Discharge Encounter: APP Time: 25 minutes   Signed, Jiles Mote, PA-C 07/17/2023, 12:34 PM  I have personally seen and examined this patient. I agree with the assessment and plan as outlined above.  48 yo male with HTN, HLD admitted with inferolateral STEMI secondary to severe stenosis mid Circumflex. One DES placed. Normal Lv function by echo which I reviewed personally Labs reviewed by me EKG reviewed by me with sinus Telemetry with sinus My exam: NAD CV:RRR Lungs: clear bilaterally Plan: CAD with inferolateral stemi: d/c home today on ASA/Effient for one year, statin and beta blocker.   30 minutes spent by on chart review, telemetry review, EKG review, notes review, examination and formulation of plan including time at the bedside speaking to pt and his family, note preparation.   Antoinette Batman, MD, [facility] 07/17/2023 1:03 PM

## 2023-07-18 ENCOUNTER — Telehealth: Payer: Self-pay

## 2023-07-18 ENCOUNTER — Telehealth: Payer: Self-pay | Admitting: Cardiovascular Disease

## 2023-07-18 LAB — LIPOPROTEIN A (LPA): Lipoprotein (a): 38.9 nmol/L — ABNORMAL HIGH (ref <75.0)

## 2023-07-18 NOTE — Telephone Encounter (Signed)
 Patient would like to clarify why he was referred to pharmD for workup. He says he was prescribed specific medications when discharged. Please advise.

## 2023-07-18 NOTE — Telephone Encounter (Signed)
Left message for pt to call back to further discuss.

## 2023-07-18 NOTE — Transitions of Care (Post Inpatient/ED Visit) (Signed)
   07/18/2023  Name: Jose Reyes MRN: [id number] DOB: 1975/05/04  Today's TOC FU Call Status: Today's TOC FU Call Status:: Successful TOC FU Call Completed TOC FU Call Complete Date: 07/18/23 Patient's Name and Date of Birth confirmed.  Transition Care Management Follow-up Telephone Call Date of Discharge: 07/17/23 Discharge Facility: [facility]) Type of Discharge: Inpatient Admission Primary Inpatient Discharge Diagnosis:: STEMI How have you been since you were released from the hospital?: Better Any questions or concerns?: No  Items Reviewed: Did you receive and understand the discharge instructions provided?: Yes Medications obtained,verified, and reconciled?: Yes (Medications Reviewed) Any new allergies since your discharge?: No Dietary orders reviewed?: Yes Do you have support at home?: Yes People in Home [RPT]: significant other  Medications Reviewed Today: Medications Reviewed Today     Reviewed by Darrall Ellison, LPN (Licensed Practical Nurse) on 07/18/23 at [phone]  Med List Status: <None>   Medication Order Taking? Sig Documenting Provider Last Dose Status Informant  aspirin 81 MG chewable tablet [id number]  Chew 1 tablet (81 mg total) by mouth daily. Parcells, Cyrilla Drivers, PA-C  Active   atorvastatin (LIPITOR) 80 MG tablet [id number]  Take 1 tablet (80 mg total) by mouth daily. Parcells, Cyrilla Drivers, PA-C  Active   EPINEPHrine  (EPIPEN  2-PAK) 0.3 mg/0.3 mL IJ SOAJ injection [id number] No Inject 0.3 mLs (0.3 mg total) into the muscle as needed for anaphylaxis. Koberlein, Junell C, MD Taking Active Self           Med Note Yves Herb, MEGAN   Wed Jul 17, 2023  8:29 AM) Has on hand if needed.  metoprolol succinate (TOPROL-XL) 25 MG 24 hr tablet [id number]  Take 1 tablet (25 mg total) by mouth daily. Parcells, Cyrilla Drivers, PA-C  Active   prasugrel (EFFIENT) 10 MG TABS tablet [id number]  Take 1 tablet (10 mg total) by mouth daily. Jiles Mote, PA-C  Active   sertraline  (ZOLOFT )  100 MG tablet [id number] No TAKE 1.5 TABLETS (150 MG TOTAL) BY MOUTH DAILY Aida House, MD 07/15/2023 Active Self            Home Care and Equipment/Supplies: Were Home Health Services Ordered?: NA Any new equipment or medical supplies ordered?: NA  Functional Questionnaire: Do you need assistance with bathing/showering or dressing?: No Do you need assistance with meal preparation?: No Do you need assistance with eating?: No Do you have difficulty maintaining continence: No Do you need assistance with getting out of bed/getting out of a chair/moving?: No Do you have difficulty managing or taking your medications?: No  Follow up appointments reviewed: PCP Follow-up appointment confirmed?: Yes Date of PCP follow-up appointment?: 07/23/23 Follow-up Provider: [facility] Follow-up appointment confirmed?: Yes Date of Specialist follow-up appointment?: 07/26/23 Follow-Up Specialty Provider:: Cardio Do you need transportation to your follow-up appointment?: No Do you understand care options if your condition(s) worsen?: Yes-patient verbalized understanding    SIGNATURE Darrall Ellison, LPN [facility] Nurse Health Advisor Direct Dial [phone]

## 2023-07-23 ENCOUNTER — Ambulatory Visit (INDEPENDENT_AMBULATORY_CARE_PROVIDER_SITE_OTHER): Admitting: Family Medicine

## 2023-07-23 ENCOUNTER — Encounter: Payer: Self-pay | Admitting: Family Medicine

## 2023-07-23 VITALS — BP 110/68 | HR 65 | Temp 97.9°F | Ht 76.0 in | Wt 216.8 lb

## 2023-07-23 DIAGNOSIS — I2510 Atherosclerotic heart disease of native coronary artery without angina pectoris: Secondary | ICD-10-CM | POA: Diagnosis not present

## 2023-07-23 DIAGNOSIS — R5383 Other fatigue: Secondary | ICD-10-CM

## 2023-07-23 DIAGNOSIS — E782 Mixed hyperlipidemia: Secondary | ICD-10-CM | POA: Diagnosis not present

## 2023-07-23 NOTE — Assessment & Plan Note (Signed)
 Patient is s/p acute MI with critical stenosis in the right circumflex. He is doing well after the drug eluting stent was placed. EF was WNL on the ECHO. He will continue DAPT, BB, statin. He will need repeat lipid panel and CMP in 1-2 months to follow up on the medication. Orders placed I spent 30 minutes today with the patient reviewing discharge summary, consult notes, labs, and imaging studies.

## 2023-07-23 NOTE — Progress Notes (Signed)
 Established Patient Office Visit  Subjective   Patient ID: Jose Reyes, male    DOB: 04/29/1975  Age: 48 y.o. MRN: [id number]  Chief Complaint  Patient presents with   Hospitalization Follow-up    Pt is here for hospital discharge follow up. He sustained an acute MI on 07/17/23 in the right circumflex artery. 1 drug eluting stent was placed during the heart cath and he was placed on multiple medications including dual antiplatelet therapy. States that he is feeling good since he was discharged. States he is tolerating his medications well, no side effects from them at this time.  States that he did have a family history of early heart disease -- states his father's father died early 22's of heart disease, also found out that his mother's father had early onset heart disease.   States that he did go to the gym and did a light work out, did ok with that, no chest pain    Current Outpatient Medications  Medication Instructions   Aspirin  Low Dose 81 mg, Oral, Daily   atorvastatin  (LIPITOR) 80 mg, Oral, Daily   EPINEPHrine  (EPIPEN  2-PAK) 0.3 mg, Intramuscular, As needed   metoprolol  succinate (TOPROL -XL) 25 mg, Oral, Daily   prasugrel  (EFFIENT ) 10 mg, Oral, Daily   sertraline  (ZOLOFT ) 100 MG tablet TAKE 1.5 TABLETS (150 MG TOTAL) BY MOUTH DAILY    Patient Active Problem List   Diagnosis Date Noted   CAD (coronary artery disease) 07/17/2023   HTN (hypertension) 07/17/2023   HLD (hyperlipidemia) 07/17/2023   S/P drug eluting coronary stent placement 07/17/2023   ST elevation myocardial infarction (STEMI) of inferolateral wall (HCC) 07/16/2023   Hordeolum externum of left eye 11/28/2021   Nocturnal enuresis 05/14/2018   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     Objective:     BP 110/68   Pulse 65   Temp 97.9 F (36.6 C) (Oral)   Ht 6\' 4"  (1.93 m)   Wt 216 lb  12.8 oz (98.3 kg)   SpO2 99%   BMI 26.39 kg/m    Physical Exam Vitals reviewed.  Constitutional:      Appearance: Normal appearance. He is well-groomed and normal weight.  Cardiovascular:     Rate and Rhythm: Normal rate and regular rhythm.     Heart sounds: S1 normal and S2 normal. No murmur heard. Pulmonary:     Effort: Pulmonary effort is normal.     Breath sounds: Normal breath sounds and air entry. No rales.  Musculoskeletal:     Right lower leg: No edema.     Left lower leg: No edema.  Neurological:     General: No focal deficit present.     Mental Status: He is alert and oriented to person, place, and time.     Gait: Gait is intact.  Psychiatric:        Mood and Affect: Mood and affect normal.      No results found for any visits on 07/23/23.    The ASCVD Risk score (Arnett DK, et al., 2019) failed to calculate for the following reasons:   Risk score cannot be calculated because patient has a medical history suggesting prior/existing ASCVD    Assessment & Plan:  Mixed hyperlipidemia -     Lipid panel; Future -     Comprehensive metabolic panel with GFR; Future  Other  fatigue Pt was also asking about testosterone replacement, states that his "buddies at the gym" are all taking testosterone for low energy. I counseled the patient on the risks/benefits of taking testosterone without having a diagnosis of low T -- I advised that we check his testosterone levels first to see if they are low before deciding if he needs testosterone.   -     Testosterone; Future  Coronary artery disease involving native coronary artery of native heart without angina pectoris Assessment & Plan: Patient is s/p acute MI with critical stenosis in the right circumflex. He is doing well after the drug eluting stent was placed. EF was WNL on the ECHO. He will continue DAPT, BB, statin. He will need repeat lipid panel and CMP in 1-2 months to follow up on the medication. Orders placed I spent  30 minutes today with the patient reviewing discharge summary, consult notes, labs, and imaging studies.       Return in about 6 months (around 01/23/2024) for HTN.    Aida House, MD

## 2023-07-26 ENCOUNTER — Encounter: Payer: Self-pay | Admitting: Nurse Practitioner

## 2023-07-26 ENCOUNTER — Ambulatory Visit: Attending: Nurse Practitioner | Admitting: Nurse Practitioner

## 2023-07-26 VITALS — BP 125/83 | HR 68 | Ht 76.0 in | Wt 213.2 lb

## 2023-07-26 DIAGNOSIS — I2510 Atherosclerotic heart disease of native coronary artery without angina pectoris: Secondary | ICD-10-CM

## 2023-07-26 DIAGNOSIS — I10 Essential (primary) hypertension: Secondary | ICD-10-CM | POA: Diagnosis not present

## 2023-07-26 DIAGNOSIS — E785 Hyperlipidemia, unspecified: Secondary | ICD-10-CM | POA: Diagnosis not present

## 2023-07-26 DIAGNOSIS — Z955 Presence of coronary angioplasty implant and graft: Secondary | ICD-10-CM | POA: Diagnosis not present

## 2023-07-26 NOTE — Progress Notes (Signed)
 Office Visit    Patient Name: Jose Reyes Date of Encounter: 07/26/2023 Primary Care Provider:  Aida House, MD Primary Cardiologist:  Antoinette Batman, MD  Chief Complaint    48 year old male with a history of CAD s/p STEMI, DES-mLCx in 07/2023, hypertension, hyperlipidemia, and testicular cancer who presents for hospital follow-up related to CAD s/p STEMI, DES-mLCx.   Past Medical History    Past Medical History:  Diagnosis Date   Reflux    Testicular cancer (HCC) 1997   treated with surgery x 2, chemo.    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   Mid Cx lesion is 99% stenosed.   1st Diag lesion is 40% stenosed.   Prox LAD to Mid LAD lesion is 20% stenosed.   A drug-eluting stent was successfully placed using a STENT SYNERGY XD 3.0X20.   Post intervention, there is a 0% residual  stenosis.  Acute inferolateral STEMI secondary to severe stenosis in the mid Circumflex Successful PTCA/DES x 1 mid Circumflex Mild non-obstructive disease in the mid LAD, diagonal and RCA Normal LV systolic function  Recommendations: Monitor overnight in ICU. DAPT for one year with ASA and Brilinta . High intensity statin. Echo today. Aggrastat  2 hours post PCI  Findings Coronary Findings Diagnostic  Dominance: Right  Left Anterior Descending Vessel is large. Prox LAD to Mid LAD lesion is 20% stenosed.  First Diagonal Branch Vessel is moderate in size. 1st Diag lesion is 40% stenosed.  Left Circumflex Vessel is large. Mid Cx lesion is 99% stenosed. The lesion is thrombotic and ulcerative.  Right Coronary Artery Vessel is large. Mid RCA lesion is 20% stenosed.  Intervention  Mid Cx lesion Stent CATH VISTA GUIDE 6FR XBLD 3.5 guide catheter was inserted. Lesion crossed with guidewire using a WIRE ASAHI PROWATER 180CM. Pre-stent angioplasty was performed using a BALLOON SAPPHIRE 2.0X15. A drug-eluting stent was successfully placed using a STENT SYNERGY XD 3.0X20. Post-stent angioplasty was performed using a BALLOON [place] EMERGE MR 3.25X15. Post-Intervention Lesion Assessment The intervention was successful. Pre-interventional TIMI flow is 2. Post-intervention TIMI flow is 3. No complications occurred at this lesion. There is a 0% residual stenosis post intervention.     ECHOCARDIOGRAM  ECHOCARDIOGRAM COMPLETE 07/17/2023  Narrative ECHOCARDIOGRAM REPORT    Patient Name:   Jose R Garcia Date of Exam: 07/17/2023 Medical Rec #:  [id number]     Height:       76.0 in Accession #:    [id number]  Weight:       218.0 lb Date of Birth:  [birth year]     BSA:          2.298 m Patient Age:    47 years      BP:           134/98 mmHg Patient Gender: M             HR:           67 bpm. Exam Location:  Inpatient  Procedure: 2D Echo, Cardiac Doppler and Color Doppler (Both Spectral and  Color Flow Doppler were utilized during procedure).  Indications:    CAD  History:        Patient has no prior history of Echocardiogram examinations.  Sonographer:    Hersey Lorenzo RDCS Referring Phys: 68 CHRISTOPHER D MCALHANY  IMPRESSIONS   1. Left ventricular ejection fraction, by estimation, is 60 to 65%. The left ventricle has normal function. The left ventricle has no regional wall motion abnormalities. Left ventricular diastolic parameters are consistent with Grade I diastolic dysfunction (impaired relaxation). 2. Right ventricular systolic function is normal. The right ventricular size is normal. 3. The mitral valve is normal in structure. No evidence of mitral valve regurgitation. No evidence of mitral stenosis. 4. The aortic valve is normal in structure. Aortic valve regurgitation is not visualized. No aortic stenosis is present. 5. The inferior vena cava is normal in size with greater than 50% respiratory variability, suggesting right atrial pressure of 3 mmHg.  FINDINGS Left Ventricle: Left ventricular ejection fraction, by estimation, is 60 to 65%. The left ventricle has normal function. The left ventricle has no regional wall motion abnormalities. The left ventricular internal cavity size was normal in size. There is no left ventricular hypertrophy. Left ventricular diastolic parameters are consistent with Grade I diastolic dysfunction (impaired relaxation).  Right Ventricle: The right ventricular size is normal. No increase in right ventricular wall thickness. Right ventricular systolic function is normal.  Left Atrium: Left atrial size was normal in size.  Right Atrium: Right atrial size was normal in size.  Pericardium: There is no evidence of pericardial effusion.  Mitral Valve: The mitral valve is normal in structure. No evidence of mitral valve regurgitation. No evidence of mitral valve stenosis.  Tricuspid Valve: The tricuspid valve is normal in structure.  Tricuspid valve regurgitation is not demonstrated. No evidence of tricuspid stenosis.  Aortic Valve: The aortic valve is normal in structure. Aortic valve regurgitation is not visualized. No aortic stenosis is present.  Pulmonic Valve: The pulmonic valve was normal in structure. Pulmonic valve regurgitation is not visualized. No evidence of pulmonic stenosis.  Aorta: The aortic root is normal in size and structure.  Venous: The inferior vena cava is normal in size with greater than 50% respiratory variability, suggesting right atrial pressure of 3 mmHg.  IAS/Shunts: No atrial level shunt detected by color flow Doppler.   LEFT VENTRICLE PLAX 2D LVIDd:         5.10 cm      Diastology LVIDs:         3.30 cm      LV e' medial:    5.66 cm/s LV PW:         1.10 cm      LV E/e' medial:  7.5 LV IVS:        1.10 cm      LV e' lateral:   9.57 cm/s LVOT diam:     2.40 cm  LV E/e' lateral: 4.4 LV SV:         73 LV SV Index:   32 LVOT Area:     4.52 cm  LV Volumes (MOD) LV vol d, MOD A4C: 150.0 ml LV vol s, MOD A4C: 59.2 ml LV SV MOD A4C:     150.0 ml  RIGHT VENTRICLE             IVC RV S prime:     10.40 cm/s  IVC diam: 1.10 cm TAPSE (M-mode): 2.6 cm  LEFT ATRIUM           Index LA diam:      3.60 cm 1.57 cm/m LA Vol (A4C): 38.8 ml 16.89 ml/m AORTIC VALVE LVOT Vmax:   79.90 cm/s LVOT Vmean:  55.100 cm/s LVOT VTI:    0.161 m  AORTA Ao Root diam: 3.30 cm Ao Asc diam:  3.30 cm  MITRAL VALVE MV Area (PHT): 2.76 cm    SHUNTS MV Decel Time: 275 msec    Systemic VTI:  0.16 m MV E velocity: 42.50 cm/s  Systemic Diam: 2.40 cm MV A velocity: 56.00 cm/s MV E/A ratio:  0.76  Dorothye Gathers MD Electronically signed by Dorothye Gathers MD Signature Date/Time: 07/17/2023/3:55:34 PM    Final          ______________________________________________________________________________________________     Recent Labs: 07/16/2023: ALT 25 07/17/2023: BUN 11; Creatinine, Ser 0.90; Hemoglobin  15.4; Platelets 190; Potassium 3.9; Sodium 138  Recent Lipid Panel    Component Value Date/Time   CHOL 238 (H) 07/16/2023 1100   TRIG 61 07/16/2023 1100   TRIG 60 02/11/2006 0957   HDL 49 07/16/2023 1100   CHOLHDL 4.9 07/16/2023 1100   VLDL 12 07/16/2023 1100   LDLCALC 177 (H) 07/16/2023 1100   LDLDIRECT 156.1 01/23/2012 0921    History of Present Illness    48 year old male with the above past medical history including CAD s/p STEMI, DES-mLCx in 07/2023, hypertension, hyperlipidemia, and testicular cancer.  [facility] on 07/16/2023 in the setting of ST EMI.  Cardiac catheterization revealed severe stenosis of the mid LCx, 99%, 40% D1 stenosis, 20% proximal to mid LAD stenosis, s/p DES, 20% mid RCA stenosis, normal LV systolic function.  He was started on aspirin  and Effient , high intensity statin.  Echocardiogram showed EF 60 to 65%, normal LV function, no RWMA, G1 DD, normal RV systolic function, no significant valvular abnormalities.  He was discharged home in stable condition on 07/17/2023.  He presents today for follow-up.  Since his hospitalization he has done well from a cardiac standpoint.  He feels "back to normal."   He is back to exercising.  He denies any symptoms concerning for angina.  He discussed possible initiation of testosterone therapy with his PCP.  He is asking if this would be acceptable from a cardiac standpoint.  Otherwise,  he reports feeling well.   Home Medications    Current Outpatient Medications  Medication Sig Dispense Refill   aspirin  81 MG chewable tablet Chew 1 tablet (81 mg total) by mouth daily. 90 tablet 3   atorvastatin  (LIPITOR) 80 MG tablet Take 1 tablet (80 mg total) by mouth daily. 90 tablet 3   EPINEPHrine  (EPIPEN  2-PAK) 0.3 mg/0.3 mL IJ SOAJ injection Inject 0.3 mLs (0.3 mg total) into the muscle as needed for anaphylaxis. 1 Device 2   metoprolol  succinate (TOPROL -XL) 25 MG 24 hr tablet Take 1 tablet (25 mg total) by mouth  daily. 90 tablet 3   prasugrel  (EFFIENT ) 10 MG TABS tablet Take 1 tablet (10 mg total) by mouth daily. 90 tablet 3   sertraline  (ZOLOFT ) 100 MG tablet TAKE 1.5 TABLETS (150 MG TOTAL) BY MOUTH DAILY 135 tablet 1   No current facility-administered medications for this visit.     Review of Systems    He denies chest pain, palpitations, dyspnea, pnd, orthopnea, n, v, dizziness, syncope, edema, weight gain, or early satiety. All other systems reviewed and are otherwise negative except as noted above.     Cardiac Rehabilitation Eligibility Assessment  The patient has declined or is not appropriate for cardiac rehabilitation.    Physical Exam    VS:  BP 125/83   Pulse 68   Ht 6\' 4"  (1.93 m)   Wt 213 lb 3.2 oz (96.7 kg)   SpO2 99%   BMI 25.95 kg/m   GEN: Well nourished, well developed, in no acute distress. HEENT: normal. Neck: Supple, no JVD, carotid bruits, or masses. Cardiac: RRR, no murmurs, rubs, or gallops. No clubbing, cyanosis, edema.  Radials/DP/PT 2+ and equal bilaterally. R radial cath site with mild bruising, no bleeding, bruit or hematoma.  Respiratory:  Respirations regular and unlabored, clear to auscultation bilaterally. GI: Soft, nontender, nondistended, BS + x 4. MS: no deformity or atrophy. Skin: warm and dry, no rash. Neuro:  Strength and sensation are intact. Psych: Normal affect.  Accessory Clinical Findings    ECG personally reviewed by me today - EKG Interpretation Date/Time:  Friday Jul 26 2023 14:45:58 EDT Ventricular Rate:  68 PR Interval:  172 QRS Duration:  100 QT Interval:  398 QTC Calculation: 423 R Axis:   50  Text Interpretation: Normal sinus rhythm Inferior infarct (cited on or before 17-Jul-2023) T wave abnormality, consider lateral ischemia When compared with ECG of 17-Jul-2023 06:57, Premature ventricular complexes are no longer Present T wave inversion now evident in Lateral leads QT has shortened Confirmed by Marlana Silvan ([id number]) on  07/26/2023 3:29:50 PM  - no acute changes.   Lab Results  Component Value Date   WBC 9.3 07/17/2023   HGB 15.4 07/17/2023   HCT 44.3 07/17/2023   MCV 90.6 07/17/2023   PLT 190 07/17/2023   Lab Results  Component Value Date   CREATININE 0.90 07/17/2023   BUN 11 07/17/2023   NA 138 07/17/2023   K 3.9 07/17/2023   CL 107 07/17/2023   CO2 21 (L) 07/17/2023   Lab Results  Component Value Date   ALT 25 07/16/2023   AST 23 07/16/2023   ALKPHOS 48 07/16/2023   BILITOT 0.7 07/16/2023   Lab Results  Component Value Date   CHOL 238 (H) 07/16/2023   HDL 49 07/16/2023   LDLCALC 177 (H) 07/16/2023   LDLDIRECT 156.1 01/23/2012   TRIG 61 07/16/2023   CHOLHDL 4.9 07/16/2023    Lab Results  Component Value Date   HGBA1C 4.9 07/16/2023    Assessment & Plan   1. CAD: S/p STEMI, DES-mLCx in 07/2023. Stable with no anginal symptoms.  Will reach out to Dr. Abel Hoe regarding possibility of testosterone therapyContinue aspirin , Effient , metoprolol , Lipitor.   2. Hypertension: BP well controlled. Continue current antihypertensive regimen.   3. Hyperlipidemia: LDL was 177 in 07/2023. Plan for follow-up with Pharm.D. (referral was placed at hospitalization).  Repeat fasting lipids, LFTs pending pharmacy consult.  Continue Lipitor.  4. Disposition: Follow-up in 3 months.    Jude Norton, NP 07/28/2023, 1:00 PM

## 2023-07-26 NOTE — Patient Instructions (Signed)
 Medication Instructions:  Your physician recommends that you continue on your current medications as directed. Please refer to the Current Medication list given to you today.  *If you need a refill on your cardiac medications before your next appointment, please call your pharmacy*  Lab Work: NONE ordered at this time of appointment   Testing/Procedures: NONE ordered at this time of appointment   Follow-Up: At [facility], you and your health needs are our priority.  As part of our continuing mission to provide you with exceptional heart care, our providers are all part of one team.  This team includes your primary Cardiologist (physician) and Advanced Practice Providers or APPs (Physician Assistants and Nurse Practitioners) who all work together to provide you with the care you need, when you need it.  Your next appointment:   3 month(s) (Dr. Abel Hoe or Marlana Silvan NP Please schedule Pharm-D appointment   Provider:   Antoinette Batman, MD or Marlana Silvan, NP          We recommend signing up for the patient portal called "MyChart".  Sign up information is provided on this After Visit Summary.  MyChart is used to connect with patients for Virtual Visits (Telemedicine).  Patients are able to view lab/test results, encounter notes, upcoming appointments, etc.  Non-urgent messages can be sent to your provider as well.   To learn more about what you can do with MyChart, go to [email or web address].   Other Instructions

## 2023-07-28 ENCOUNTER — Encounter: Payer: Self-pay | Admitting: Nurse Practitioner

## 2023-07-31 ENCOUNTER — Encounter: Payer: Self-pay | Admitting: Pharmacist Clinician (PhC)/ Clinical Pharmacy Specialist

## 2023-07-31 ENCOUNTER — Other Ambulatory Visit (HOSPITAL_COMMUNITY): Payer: Self-pay

## 2023-07-31 ENCOUNTER — Telehealth: Payer: Self-pay

## 2023-07-31 ENCOUNTER — Telehealth: Payer: Self-pay | Admitting: Pharmacist Clinician (PhC)/ Clinical Pharmacy Specialist

## 2023-07-31 ENCOUNTER — Ambulatory Visit: Attending: Cardiology | Admitting: Pharmacist Clinician (PhC)/ Clinical Pharmacy Specialist

## 2023-07-31 DIAGNOSIS — E785 Hyperlipidemia, unspecified: Secondary | ICD-10-CM | POA: Diagnosis not present

## 2023-07-31 MED ORDER — REPATHA SURECLICK 140 MG/ML ~~LOC~~ SOAJ: 140.0000 mg | SUBCUTANEOUS | 0 refills | Status: DC

## 2023-07-31 MED ORDER — ROSUVASTATIN CALCIUM 20 MG PO TABS: 20.0000 mg | ORAL_TABLET | Freq: Every day | ORAL | 3 refills | Status: DC

## 2023-07-31 NOTE — Assessment & Plan Note (Addendum)
 Assessment: Patient with ASCVD and likely familial hyperlipidemia not at LDL goal of < 70 Most recent LDL 177 on 07/16/23 Has been compliant with high intensity statin  Has developed myalgias in his shoulders with atorvastatin  Reviewed options for lowering LDL cholesterol, including PCSK-9 inhibitors and inclisiran.  Discussed mechanisms of action, dosing, side effects, potential decreases in LDL cholesterol and costs.  Also reviewed potential options for patient assistance.  Plan: Patient agreeable to starting Repatha 140 mg q14d Sample of Repatha given to patient today, will give first dose at home this evening.   Stop atorvastatin  and wait 5 days.  Then start rosuvastatin 20 mg daily.  Reach out to us  if shoulder pain continues Repeat labs after:  3 months Lipid Liver function Patient was given information on manufacturer copay card

## 2023-07-31 NOTE — Telephone Encounter (Signed)
 Please do PA for repatha.  Not at LDL goal on statins

## 2023-07-31 NOTE — Telephone Encounter (Signed)
 See separate encounter

## 2023-07-31 NOTE — Telephone Encounter (Signed)
 Pharmacy Patient Advocate Encounter  Insurance verification completed.   The patient is insured through [employer] claim for REPATHA. Currently a quantity of 2 ML is a 28 day supply and the co-pay is $24.99 AT [facility] WITH EVOUCHER . The current 28 day co-pay is, $24.99 AT [facility].  No PA needed at this time.  This test claim was processed through [facility]- copay amounts may vary at other pharmacies due to pharmacy/plan contracts, or as the patient moves through the different stages of their insurance plan.    Our evoucher paid $527.22 towards copay, leaving pt with a $24.99 cost. Deductible will be met with this fill. Please keep in mind if patient fills at an outside pharmacy, they will likely be expected to pay a copay of $552.21. Encouraged RPH to advise patient to use our pharmacy.

## 2023-07-31 NOTE — Patient Instructions (Signed)
 Your Results:             Your most recent labs Goal  Total Cholesterol 238 < 200  Triglycerides 61 < 150  HDL (happy/good cholesterol) 49 > 40  LDL (lousy/bad cholesterol 177 < 70   Medication changes:  We will start the process to get Repatha covered by your insurance.  Once the prior authorization is complete, I will call/send a MyChart message to let you know and confirm pharmacy information.   You will take one injection every 14 days  Stop atorvastatin .  Wait 5 days and start rosuvastatin 20 mg once daily on Monday  Lab orders:  We want to repeat labs after 2-3 months.  We will send you a lab order to remind you once we get closer to that time.    Repatha Co-pay Card Instructions 1.      Go to [email or web address]  2.      Click the red "Repahta Co-Pay Card" button near the top right 3.      Scroll down and click "Yes, I have a Repatha prescription 4.      Continue to scroll down and selected "[employer]"  5.      Continue to scroll down and for the question "Are you eligible for Medicare but receiving prescription drug coverage from a former employer, union, or welfare plan?" select "No" 6.      Continue to scroll down and click the first box which is next to "Repatha Co-Pay Card, and beneath that, select "I want to enroll in the Repatha Co-Pay Card Program" 7.      Continue to scroll down until you see the "Next" button, then click it 8.      Fill out your personal information then click the "Next" button    Thank you for choosing [facility]

## 2023-07-31 NOTE — Progress Notes (Signed)
 Office Visit    Patient Name: Jose Reyes Date of Encounter: 07/31/2023  Primary Care Provider:  Aida House, MD Primary Cardiologist:  Antoinette Batman, MD  Chief Complaint    Hyperlipidemia   Significant Past Medical History   CAD 5/25 - S/P STEMI DES mLCx  HTN Controlled on metoprolol  succ     Allergies  Allergen Reactions   Amoxicillin Hives   Penicillins Hives   Bee Venom     History of Present Illness    Jose R Tollison is a 48 y.o. male patient of Dr Abel Hoe, in the office today to discuss options for cholesterol management.  He had a recent MI and is doing better now.  Back at the gym most days.  He has noticed that his shoulders have become painful with movement in the past few days.    Insurance Carrier:  Engineer, mining:  Engineer, drilling: no, copay card     LDL Cholesterol goal:   LDL < 70  Current Medications:   atorvastatin  80 mg every day   Family Hx: both grandfathers died in 9, one in 36's both from MI; father died form cancer 15 yrs ago, mom has CAC and on lipid lowering therapy, now 11; brother healthy, daughter 81 POTS  Social Hx: Tobacco: no Alcohol: beer, Lesly Raspberry, few times per week   Diet:    half/half - out tries to find heathy choices, rare fast foods; healthy at home; eating more protein, salmon and salads regulary  Exercise: daily gym, started back after 1 week - lifting sand some cardio, about 1 hour daily  Accessory Clinical Findings   Lab Results  Component Value Date   CHOL 238 (H) 07/16/2023   HDL 49 07/16/2023   LDLCALC 177 (H) 07/16/2023   LDLDIRECT 156.1 01/23/2012   TRIG 61 07/16/2023   CHOLHDL 4.9 07/16/2023    Lipoprotein (a)  Date/Time Value Ref Range Status  07/17/2023 03:02 AM 38.9 (H) <75.0 nmol/L Final    Comment:    (NOTE) Note:  Values greater than or equal to 75.0 nmol/L may       indicate an independent risk factor for CHD,       but must be evaluated with  caution when applied       to non-Caucasian populations due to the       influence of genetic factors on Lp(a) across       ethnicities. Performed At: [facility] [address] [place], [place] [id number] Pearlean Botts MD GM:[id number]     Lab Results  Component Value Date   ALT 25 07/16/2023   AST 23 07/16/2023   ALKPHOS 48 07/16/2023   BILITOT 0.7 07/16/2023   Lab Results  Component Value Date   CREATININE 0.90 07/17/2023   BUN 11 07/17/2023   NA 138 07/17/2023   K 3.9 07/17/2023   CL 107 07/17/2023   CO2 21 (L) 07/17/2023   Lab Results  Component Value Date   HGBA1C 4.9 07/16/2023    Home Medications    Current Outpatient Medications  Medication Sig Dispense Refill   Evolocumab (REPATHA SURECLICK) 140 MG/ML SOAJ Inject 140 mg into the skin every 14 (fourteen) days. 1 mL 0   rosuvastatin (CRESTOR) 20 MG tablet Take 1 tablet (20 mg total) by mouth daily. 30 tablet 3   aspirin  81 MG chewable tablet Chew 1 tablet (81 mg total) by mouth daily. 90 tablet 3   EPINEPHrine  (EPIPEN   2-PAK) 0.3 mg/0.3 mL IJ SOAJ injection Inject 0.3 mLs (0.3 mg total) into the muscle as needed for anaphylaxis. 1 Device 2   metoprolol  succinate (TOPROL -XL) 25 MG 24 hr tablet Take 1 tablet (25 mg total) by mouth daily. 90 tablet 3   prasugrel  (EFFIENT ) 10 MG TABS tablet Take 1 tablet (10 mg total) by mouth daily. 90 tablet 3   sertraline  (ZOLOFT ) 100 MG tablet TAKE 1.5 TABLETS (150 MG TOTAL) BY MOUTH DAILY 135 tablet 1   No current facility-administered medications for this visit.     Assessment & Plan    HLD (hyperlipidemia) Assessment: Patient with ASCVD and likely familial hyperlipidemia not at LDL goal of < 70 Most recent LDL 177 on 07/16/23 Has been compliant with high intensity statin  Has developed myalgias in his shoulders with atorvastatin  Reviewed options for lowering LDL cholesterol, including PCSK-9 inhibitors and inclisiran.  Discussed mechanisms of action, dosing, side  effects, potential decreases in LDL cholesterol and costs.  Also reviewed potential options for patient assistance.  Plan: Patient agreeable to starting Repatha 140 mg q14d Sample of Repatha given to patient today, will give first dose at home this evening.   Stop atorvastatin  and wait 5 days.  Then start rosuvastatin 20 mg daily.  Reach out to us  if shoulder pain continues Repeat labs after:  3 months Lipid Liver function Patient was given information on manufacturer copay card   Donivan Furry, PharmD CPP [facility] [address]  [place], [place] [address] [phone]  07/31/2023, 2:30 PM

## 2023-08-02 NOTE — Telephone Encounter (Signed)
 Seen by pharmD 07/31/23.  Repatha prescribed.

## 2023-08-06 ENCOUNTER — Other Ambulatory Visit: Payer: Self-pay | Admitting: Family Medicine

## 2023-08-06 DIAGNOSIS — Z731 Type A behavior pattern: Secondary | ICD-10-CM

## 2023-08-08 ENCOUNTER — Encounter (HOSPITAL_COMMUNITY): Payer: Self-pay

## 2023-08-08 ENCOUNTER — Telehealth (HOSPITAL_COMMUNITY): Payer: Self-pay

## 2023-08-08 NOTE — Telephone Encounter (Signed)
Attempted to call patient in regards to Cardiac Rehab - LM on VM   Sent letter 

## 2023-08-16 ENCOUNTER — Other Ambulatory Visit: Payer: Self-pay | Admitting: Pharmacist Clinician (PhC)/ Clinical Pharmacy Specialist

## 2023-08-16 MED ORDER — PRASUGREL HCL 10 MG PO TABS: 10.0000 mg | ORAL_TABLET | Freq: Every day | ORAL | 3 refills | Status: AC

## 2023-08-16 MED ORDER — METOPROLOL SUCCINATE ER 25 MG PO TB24: 25.0000 mg | ORAL_TABLET | Freq: Every day | ORAL | 3 refills | Status: AC

## 2023-08-16 MED ORDER — REPATHA SURECLICK 140 MG/ML ~~LOC~~ SOAJ: 140.0000 mg | SUBCUTANEOUS | Status: DC

## 2023-08-16 MED ORDER — ROSUVASTATIN CALCIUM 20 MG PO TABS: 20.0000 mg | ORAL_TABLET | Freq: Every day | ORAL | 3 refills | Status: AC

## 2023-08-16 NOTE — Addendum Note (Signed)
 Addended by: Lysa Livengood L on: 08/16/2023 03:41 PM   Modules accepted: Orders

## 2023-08-22 ENCOUNTER — Telehealth (HOSPITAL_COMMUNITY): Payer: Self-pay

## 2023-08-22 NOTE — Telephone Encounter (Signed)
 2 week f/u call, no answer- left message informing patient we have been unable to reach him and will be closing cardiac rehab referral. If he has any questions or would like us  to reopen it to give us  a call back.  Patient has also previously been sent a MyChart message.

## 2023-08-27 ENCOUNTER — Other Ambulatory Visit: Payer: Self-pay | Admitting: Cardiovascular Disease

## 2023-08-27 ENCOUNTER — Other Ambulatory Visit (HOSPITAL_BASED_OUTPATIENT_CLINIC_OR_DEPARTMENT_OTHER): Payer: Self-pay

## 2023-08-27 DIAGNOSIS — E785 Hyperlipidemia, unspecified: Secondary | ICD-10-CM

## 2023-08-27 DIAGNOSIS — I2510 Atherosclerotic heart disease of native coronary artery without angina pectoris: Secondary | ICD-10-CM

## 2023-08-27 DIAGNOSIS — Z955 Presence of coronary angioplasty implant and graft: Secondary | ICD-10-CM

## 2023-08-28 ENCOUNTER — Other Ambulatory Visit (HOSPITAL_BASED_OUTPATIENT_CLINIC_OR_DEPARTMENT_OTHER): Payer: Self-pay

## 2023-08-28 MED ORDER — REPATHA SURECLICK 140 MG/ML ~~LOC~~ SOAJ
140.0000 mg | SUBCUTANEOUS | 1 refills | Status: AC
  Filled 2023-08-28: qty 2, 28d supply, fill #0
  Filled 2023-10-29 – 2023-12-09 (×2): qty 2, 28d supply, fill #1
  Filled 2023-12-30 – 2024-02-29 (×7): qty 2, 28d supply, fill #2

## 2023-09-10 ENCOUNTER — Other Ambulatory Visit: Payer: Self-pay | Admitting: Cardiovascular Disease

## 2023-10-08 ENCOUNTER — Other Ambulatory Visit: Payer: Self-pay | Admitting: Family Medicine

## 2023-10-08 DIAGNOSIS — R32 Unspecified urinary incontinence: Secondary | ICD-10-CM

## 2023-10-30 ENCOUNTER — Other Ambulatory Visit (HOSPITAL_BASED_OUTPATIENT_CLINIC_OR_DEPARTMENT_OTHER): Payer: Self-pay

## 2023-11-06 ENCOUNTER — Ambulatory Visit: Attending: Cardiovascular Disease | Admitting: Nurse Practitioner

## 2023-11-06 NOTE — Progress Notes (Deleted)
 Office Visit    Patient Name: Jose Reyes Date of Encounter: 11/06/2023  Primary Care Provider:  Ozell Heron HERO, MD Primary Cardiologist:  Lonni Cash, MD  Chief Complaint    48 year old male with a history of CAD s/p STEMI, DES-mLCx in 07/2023, hypertension, hyperlipidemia, and testicular cancer who presents for follow-up related to CAD.   Past Medical History    Past Medical History:  Diagnosis Date   Reflux    Testicular cancer (HCC) 1997   treated with surgery x 2, chemo.    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   Mid Cx lesion is 99% stenosed.   1st Diag lesion is 40% stenosed.   Prox LAD to Mid LAD lesion is 20% stenosed.   A drug-eluting stent was successfully placed using a STENT SYNERGY XD 3.0X20.   Post intervention, there is a 0% residual stenosis.  Acute inferolateral STEMI  secondary to severe stenosis in the mid Circumflex Successful PTCA/DES x 1 mid Circumflex Mild non-obstructive disease in the mid LAD, diagonal and RCA Normal LV systolic function  Recommendations: Monitor overnight in ICU. DAPT for one year with ASA and Brilinta . High intensity statin. Echo today. Aggrastat  2 hours post PCI  Findings Coronary Findings Diagnostic  Dominance: Right  Left Anterior Descending Vessel is large. Prox LAD to Mid LAD lesion is 20% stenosed.  First Diagonal Branch Vessel is moderate in size. 1st Diag lesion is 40% stenosed.  Left Circumflex Vessel is large. Mid Cx lesion is 99% stenosed. The lesion is thrombotic and ulcerative.  Right Coronary Artery Vessel is large. Mid RCA lesion is 20% stenosed.  Intervention  Mid Cx lesion Stent CATH VISTA GUIDE 6FR XBLD 3.5 guide catheter was inserted. Lesion crossed with guidewire using a WIRE ASAHI PROWATER 180CM. Pre-stent angioplasty was performed using a BALLOON SAPPHIRE 2.0X15. A drug-eluting stent was successfully placed using a STENT SYNERGY XD 3.0X20. Post-stent angioplasty was performed using a BALLOON [place] EMERGE MR 3.25X15. Post-Intervention Lesion Assessment The intervention was successful. Pre-interventional TIMI flow is 2. Post-intervention TIMI flow is 3. No complications occurred at this lesion. There is a 0% residual stenosis post intervention.     ECHOCARDIOGRAM  ECHOCARDIOGRAM COMPLETE 07/17/2023  Narrative ECHOCARDIOGRAM REPORT    Patient Name:   Jose R Insley Date of Exam: 07/17/2023 Medical Rec #:  [id number]     Height:       76.0 in Accession #:    [id number]    Weight:  218.0 lb Date of Birth:  1975-09-21     BSA:          2.298 m Patient Age:    47 years      BP:           134/98 mmHg Patient Gender: M             HR:           67 bpm. Exam Location:  Inpatient  Procedure: 2D Echo, Cardiac Doppler and Color Doppler (Both Spectral and Color Flow Doppler were utilized during  procedure).  Indications:    CAD  History:        Patient has no prior history of Echocardiogram examinations.  Sonographer:    Tinnie Gosling RDCS Referring Phys: 56 CHRISTOPHER D MCALHANY  IMPRESSIONS   1. Left ventricular ejection fraction, by estimation, is 60 to 65%. The left ventricle has normal function. The left ventricle has no regional wall motion abnormalities. Left ventricular diastolic parameters are consistent with Grade I diastolic dysfunction (impaired relaxation). 2. Right ventricular systolic function is normal. The right ventricular size is normal. 3. The mitral valve is normal in structure. No evidence of mitral valve regurgitation. No evidence of mitral stenosis. 4. The aortic valve is normal in structure. Aortic valve regurgitation is not visualized. No aortic stenosis is present. 5. The inferior vena cava is normal in size with greater than 50% respiratory variability, suggesting right atrial pressure of 3 mmHg.  FINDINGS Left Ventricle: Left ventricular ejection fraction, by estimation, is 60 to 65%. The left ventricle has normal function. The left ventricle has no regional wall motion abnormalities. The left ventricular internal cavity size was normal in size. There is no left ventricular hypertrophy. Left ventricular diastolic parameters are consistent with Grade I diastolic dysfunction (impaired relaxation).  Right Ventricle: The right ventricular size is normal. No increase in right ventricular wall thickness. Right ventricular systolic function is normal.  Left Atrium: Left atrial size was normal in size.  Right Atrium: Right atrial size was normal in size.  Pericardium: There is no evidence of pericardial effusion.  Mitral Valve: The mitral valve is normal in structure. No evidence of mitral valve regurgitation. No evidence of mitral valve stenosis.  Tricuspid Valve: The tricuspid valve is normal in structure. Tricuspid valve regurgitation is not  demonstrated. No evidence of tricuspid stenosis.  Aortic Valve: The aortic valve is normal in structure. Aortic valve regurgitation is not visualized. No aortic stenosis is present.  Pulmonic Valve: The pulmonic valve was normal in structure. Pulmonic valve regurgitation is not visualized. No evidence of pulmonic stenosis.  Aorta: The aortic root is normal in size and structure.  Venous: The inferior vena cava is normal in size with greater than 50% respiratory variability, suggesting right atrial pressure of 3 mmHg.  IAS/Shunts: No atrial level shunt detected by color flow Doppler.   LEFT VENTRICLE PLAX 2D LVIDd:         5.10 cm      Diastology LVIDs:         3.30 cm      LV e' medial:    5.66 cm/s LV PW:         1.10 cm      LV E/e' medial:  7.5 LV IVS:        1.10 cm      LV e' lateral:   9.57 cm/s LVOT diam:     2.40 cm      LV E/e'  lateral: 4.4 LV SV:         73 LV SV Index:   32 LVOT Area:     4.52 cm  LV Volumes (MOD) LV vol d, MOD A4C: 150.0 ml LV vol s, MOD A4C: 59.2 ml LV SV MOD A4C:     150.0 ml  RIGHT VENTRICLE             IVC RV S prime:     10.40 cm/s  IVC diam: 1.10 cm TAPSE (M-mode): 2.6 cm  LEFT ATRIUM           Index LA diam:      3.60 cm 1.57 cm/m LA Vol (A4C): 38.8 ml 16.89 ml/m AORTIC VALVE LVOT Vmax:   79.90 cm/s LVOT Vmean:  55.100 cm/s LVOT VTI:    0.161 m  AORTA Ao Root diam: 3.30 cm Ao Asc diam:  3.30 cm  MITRAL VALVE MV Area (PHT): 2.76 cm    SHUNTS MV Decel Time: 275 msec    Systemic VTI:  0.16 m MV E velocity: 42.50 cm/s  Systemic Diam: 2.40 cm MV A velocity: 56.00 cm/s MV E/A ratio:  0.76  Oneil Parchment MD Electronically signed by Oneil Parchment MD Signature Date/Time: 07/17/2023/3:55:34 PM    Final          ______________________________________________________________________________________________     Recent Labs: 07/16/2023: ALT 25 07/17/2023: BUN 11; Creatinine, Ser 0.90; Hemoglobin 15.4; Platelets 190; Potassium 3.9;  Sodium 138  Recent Lipid Panel    Component Value Date/Time   CHOL 238 (H) 07/16/2023 1100   TRIG 61 07/16/2023 1100   TRIG 60 02/11/2006 0957   HDL 49 07/16/2023 1100   CHOLHDL 4.9 07/16/2023 1100   VLDL 12 07/16/2023 1100   LDLCALC 177 (H) 07/16/2023 1100   LDLDIRECT 156.1 01/23/2012 0921    History of Present Illness    48 year old male with the above past medical history including CAD s/p STEMI, DES-mLCx in 07/2023, hypertension, hyperlipidemia, and testicular cancer.   He presented to [facility] ED on 07/16/2023 in the setting of ST EMI.  Cardiac catheterization revealed severe stenosis of the mid LCx, 99%, 40% D1 stenosis, 20% proximal to mid LAD stenosis, s/p DES, 20% mid RCA stenosis, normal LV systolic function.  He was started on aspirin  and Effient , high intensity statin.  Echocardiogram showed EF 60 to 65%, normal LV function, no RWMA, G1 DD, normal RV systolic function, no significant valvular abnormalities.  He was discharged home in stable condition on 07/17/2023.  He was last seen in the office on 07/26/2023 and was stable from a cardiac standpoint.  He denies symptoms concerning for angina.  He was referred to lipid clinic Pharm.D. and was started on Repatha .   He presents today for follow-up.  Since his last visit  1. CAD: S/p STEMI, DES-mLCx in 07/2023. Stable with no anginal symptoms.  Will reach out to Dr. Verlin regarding possibility of testosterone therapyContinue aspirin , Effient , metoprolol , Lipitor.    2. Hypertension: BP well controlled. Continue current antihypertensive regimen.    3. Hyperlipidemia: LDL was 177 in 07/2023. Plan for follow-up with Pharm.D. (referral was placed at hospitalization).  Repeat fasting lipids, LFTs pending pharmacy consult.  Continue Lipitor.   4. Disposition: Follow-up in  Home Medications    Current Outpatient Medications  Medication Sig Dispense Refill   aspirin  81 MG chewable tablet Chew 1 tablet (81 mg total) by mouth daily. 90  tablet 3   EPINEPHrine  (EPIPEN  2-PAK) 0.3 mg/0.3 mL IJ  SOAJ injection Inject 0.3 mLs (0.3 mg total) into the muscle as needed for anaphylaxis. 1 Device 2   Evolocumab  (REPATHA  SURECLICK) 140 MG/ML SOAJ Inject 140 mg into the skin every 14 (fourteen) days. 6 mL 1   metoprolol  succinate (TOPROL -XL) 25 MG 24 hr tablet Take 1 tablet (25 mg total) by mouth daily. 90 tablet 3   prasugrel  (EFFIENT ) 10 MG TABS tablet Take 1 tablet (10 mg total) by mouth daily. 90 tablet 3   rosuvastatin  (CRESTOR ) 20 MG tablet Take 1 tablet (20 mg total) by mouth daily. 90 tablet 3   sertraline  (ZOLOFT ) 100 MG tablet TAKE 1.5 TABLETS (150 MG TOTAL) BY MOUTH DAILY 135 tablet 1   No current facility-administered medications for this visit.     Review of Systems    ***.  All other systems reviewed and are otherwise negative except as noted above.    Physical Exam    VS:  There were no vitals taken for this visit. , BMI There is no height or weight on file to calculate BMI.     GEN: Well nourished, well developed, in no acute distress. HEENT: normal. Neck: Supple, no JVD, carotid bruits, or masses. Cardiac: RRR, no murmurs, rubs, or gallops. No clubbing, cyanosis, edema.  Radials/DP/PT 2+ and equal bilaterally.  Respiratory:  Respirations regular and unlabored, clear to auscultation bilaterally. GI: Soft, nontender, nondistended, BS + x 4. MS: no deformity or atrophy. Skin: warm and dry, no rash. Neuro:  Strength and sensation are intact. Psych: Normal affect.  Accessory Clinical Findings    ECG personally reviewed by me today -    - no acute changes.   Lab Results  Component Value Date   WBC 9.3 07/17/2023   HGB 15.4 07/17/2023   HCT 44.3 07/17/2023   MCV 90.6 07/17/2023   PLT 190 07/17/2023   Lab Results  Component Value Date   CREATININE 0.90 07/17/2023   BUN 11 07/17/2023   NA 138 07/17/2023   K 3.9 07/17/2023   CL 107 07/17/2023   CO2 21 (L) 07/17/2023   Lab Results  Component Value Date    ALT 25 07/16/2023   AST 23 07/16/2023   ALKPHOS 48 07/16/2023   BILITOT 0.7 07/16/2023   Lab Results  Component Value Date   CHOL 238 (H) 07/16/2023   HDL 49 07/16/2023   LDLCALC 177 (H) 07/16/2023   LDLDIRECT 156.1 01/23/2012   TRIG 61 07/16/2023   CHOLHDL 4.9 07/16/2023    Lab Results  Component Value Date   HGBA1C 4.9 07/16/2023    Assessment & Plan    1.  ***  No BP recorded.  {Refresh Note OR Click here to enter BP  :1}***   Damien JAYSON Braver, NP 11/06/2023, 6:51 AM

## 2023-11-12 ENCOUNTER — Other Ambulatory Visit (HOSPITAL_BASED_OUTPATIENT_CLINIC_OR_DEPARTMENT_OTHER): Payer: Self-pay

## 2023-12-09 ENCOUNTER — Other Ambulatory Visit (HOSPITAL_BASED_OUTPATIENT_CLINIC_OR_DEPARTMENT_OTHER): Payer: Self-pay

## 2023-12-09 ENCOUNTER — Encounter: Payer: Self-pay | Admitting: Pharmacist Clinician (PhC)/ Clinical Pharmacy Specialist

## 2023-12-19 ENCOUNTER — Encounter: Payer: Self-pay | Admitting: Pharmacist Clinician (PhC)/ Clinical Pharmacy Specialist

## 2023-12-20 NOTE — Telephone Encounter (Signed)
 Spoke with patient,  answered all questions.  Encouraged use of gas-x or peppermint prior to trying oil of oregano.  Also advised to check with PCP if symptoms don't resolve after trying those options.

## 2023-12-30 ENCOUNTER — Other Ambulatory Visit (HOSPITAL_BASED_OUTPATIENT_CLINIC_OR_DEPARTMENT_OTHER): Payer: Self-pay

## 2023-12-31 ENCOUNTER — Other Ambulatory Visit: Payer: Self-pay

## 2024-01-10 ENCOUNTER — Other Ambulatory Visit (HOSPITAL_BASED_OUTPATIENT_CLINIC_OR_DEPARTMENT_OTHER): Payer: Self-pay

## 2024-01-23 ENCOUNTER — Other Ambulatory Visit (HOSPITAL_BASED_OUTPATIENT_CLINIC_OR_DEPARTMENT_OTHER): Payer: Self-pay

## 2024-02-01 ENCOUNTER — Other Ambulatory Visit: Payer: Self-pay | Admitting: Family Medicine

## 2024-02-01 DIAGNOSIS — Z731 Type A behavior pattern: Secondary | ICD-10-CM

## 2024-02-04 ENCOUNTER — Other Ambulatory Visit (HOSPITAL_BASED_OUTPATIENT_CLINIC_OR_DEPARTMENT_OTHER): Payer: Self-pay

## 2024-02-17 ENCOUNTER — Other Ambulatory Visit (HOSPITAL_BASED_OUTPATIENT_CLINIC_OR_DEPARTMENT_OTHER): Payer: Self-pay

## 2024-02-28 ENCOUNTER — Other Ambulatory Visit (HOSPITAL_BASED_OUTPATIENT_CLINIC_OR_DEPARTMENT_OTHER): Payer: Self-pay

## 2024-03-13 ENCOUNTER — Other Ambulatory Visit (HOSPITAL_BASED_OUTPATIENT_CLINIC_OR_DEPARTMENT_OTHER): Payer: Self-pay

## 2024-03-16 ENCOUNTER — Ambulatory Visit: Payer: Self-pay | Admitting: Family Medicine

## 2024-03-16 ENCOUNTER — Encounter: Payer: Self-pay | Admitting: Family Medicine

## 2024-03-16 ENCOUNTER — Other Ambulatory Visit (HOSPITAL_COMMUNITY)
Admission: RE | Admit: 2024-03-16 | Discharge: 2024-03-16 | Disposition: A | Discharge status: Home / Self Care | Source: Ambulatory Visit | Attending: Family Medicine | Admitting: Family Medicine

## 2024-03-16 VITALS — BP 150/90 | HR 71 | Temp 97.9°F | Resp 16 | Ht 76.0 in | Wt 230.0 lb

## 2024-03-16 DIAGNOSIS — Z113 Encounter for screening for infections with a predominantly sexual mode of transmission: Secondary | ICD-10-CM

## 2024-03-16 DIAGNOSIS — R5383 Other fatigue: Secondary | ICD-10-CM

## 2024-03-16 DIAGNOSIS — I10 Essential (primary) hypertension: Secondary | ICD-10-CM | POA: Diagnosis not present

## 2024-03-16 DIAGNOSIS — N529 Male erectile dysfunction, unspecified: Secondary | ICD-10-CM

## 2024-03-16 DIAGNOSIS — E782 Mixed hyperlipidemia: Secondary | ICD-10-CM | POA: Diagnosis not present

## 2024-03-16 MED ORDER — SILDENAFIL CITRATE 20 MG PO TABS: 40.0000 mg | ORAL_TABLET | Freq: Every day | ORAL | 1 refills | Status: AC | PRN

## 2024-03-16 NOTE — Patient Instructions (Addendum)
 A few things to remember from today's visit:  Primary hypertension  Screen for STD (sexually transmitted disease) - Plan: Urine cytology ancillary only Monitor blood pressure at home, goal 120's/70s'. Sildenafil  daily as needed max dose 100 mg.  If you need refills for medications you take chronically, please call your pharmacy. Do not use My Chart to request refills or for acute issues that need immediate attention. If you send a my chart message, it may take a few days to be addressed, specially if I am not in the office.  Please be sure medication list is accurate. If a new problem present, please set up appointment sooner than planned today.

## 2024-03-16 NOTE — Progress Notes (Signed)
 "  ACUTE VISIT Chief Complaint  Patient presents with   SEXUALLY TRANSMITTED DISEASE    Screening   Discussed the use of AI scribe software for clinical note transcription with the patient, who gave verbal consent to proceed. History of Present Illness Jose Reyes is a 49 year old male with past medical history significant for hypertension, CAD, and hyperlipidemia, who is here today requesting STD screening.  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  He has no symptoms or concerns of STDs himself but wants to ensure everything is fine for his partner's peace of mind. His partner suggested a 'three thing panel' for testing but not sure about specifics. Negative for dysuria, urethral discharge, or genital lesions. No prior history of STDs.  -He is BP is elevated today. He has not been monitoring his blood pressure at home recently due to a recent move.  States that he missed his three-month follow-up appointment with his cardiologist and planning on rescheduling. No recent symptoms such as unusual headaches, chest pain, difficulty breathing, or palpitations. Currently he is on metoprolol  succinate 25 mg daily.  Lab Results  Component Value Date   NA 138 07/17/2023   CL 107 07/17/2023   K 3.9 07/17/2023   CO2 21 (L) 07/17/2023   BUN 11 07/17/2023   CREATININE 0.90 07/17/2023   GFRNONAA >60 07/17/2023   CALCIUM  9.3 07/17/2023   ALBUMIN 3.8 07/16/2023   GLUCOSE 110 (H) 07/17/2023   -He inquires about medication for erectile dysfunction.  Problem has been going on for some time, he is not able to sustain an erection. He is no longer experiencing morning erections.  Negative for anatomical deformities or decreased libido. He has not taken any erectile dysfunction medication before.  Review of Systems  Constitutional:  Negative for activity change, appetite change, chills and fever.  HENT:  Negative for mouth sores and  sore throat.   Respiratory:  Negative for cough.   Cardiovascular:  Negative for leg swelling.  Gastrointestinal:  Negative for abdominal pain, nausea and vomiting.  Endocrine: Negative for cold intolerance and heat intolerance.  Genitourinary:  Negative for decreased urine volume, dysuria and hematuria.  Skin:  Negative for rash.  Neurological:  Negative for syncope, facial asymmetry and weakness.  See other pertinent positives and negatives in HPI.  Medications Ordered Prior to Encounter[1]  Past Medical History:  Diagnosis Date   Reflux    Testicular cancer (HCC) 1997   treated with surgery x 2, chemo.    Allergies[2]  Social History   Socioeconomic History   Marital status: Single    Spouse name: Not on file   Number of children: Not on file   Years of education: Not on file   Highest education level: Not on file  Occupational History   Not on file  Tobacco Use   Smoking status: Never   Smokeless tobacco: Never  Substance and Sexual Activity   Alcohol use: Yes    Alcohol/week: 10.0 standard drinks of alcohol    Types: 10 Cans of beer per week    Comment: occ   Drug use: No   Sexual activity: Yes  Other Topics Concern   Not on file  Social History Narrative   Not on file   Social Drivers of Health   Tobacco Use: Low Risk (03/16/2024)   Patient History    Smoking Tobacco Use: Never    Smokeless Tobacco  Use: Never    Passive Exposure: Not on file  Financial Resource Strain: Not on file  Food Insecurity: No Food Insecurity (07/16/2023)   Hunger Vital Sign    Worried About Running Out of Food in the Last Year: Never true    Ran Out of Food in the Last Year: Never true  Transportation Needs: No Transportation Needs (07/16/2023)   PRAPARE - Administrator, Civil Service (Medical): No    Lack of Transportation (Non-Medical): No  Physical Activity: Sufficiently Active (10/15/2022)   Exercise Vital Sign    Days of Exercise per Week: 5 days    Minutes of  Exercise per Session: 60 min  Stress: Not on file  Social Connections: Not on file  Depression (PHQ2-9): Low Risk (10/15/2022)   Depression (PHQ2-9)    PHQ-2 Score: 0  Alcohol Screen: Low Risk (10/15/2022)   Alcohol Screen    Last Alcohol Screening Score (AUDIT): 7  Housing: Low Risk (07/16/2023)   Housing Stability Vital Sign    Unable to Pay for Housing in the Last Year: No    Number of Times Moved in the Last Year: 0    Homeless in the Last Year: No  Utilities: Not At Risk (07/16/2023)   AHC Utilities    Threatened with loss of utilities: No  Health Literacy: Not on file   Vitals:   03/16/24 1436 03/16/24 1458  BP: (!) 188/100 (!) 150/90  Pulse: 71   Resp: 16   Temp: 97.9 F (36.6 C)   SpO2: 96%    Body mass index is 28 kg/m.  Physical Exam Vitals and nursing note reviewed.  Constitutional:      General: He is not in acute distress.    Appearance: He is well-developed.  HENT:     Head: Normocephalic and atraumatic.  Eyes:     Conjunctiva/sclera: Conjunctivae normal.  Cardiovascular:     Rate and Rhythm: Normal rate and regular rhythm.     Pulses:          Posterior tibial pulses are 2+ on the right side and 2+ on the left side.     Heart sounds: No murmur heard. Pulmonary:     Effort: Pulmonary effort is normal. No respiratory distress.     Breath sounds: Normal breath sounds.  Abdominal:     Palpations: Abdomen is soft. There is no mass.     Tenderness: There is no abdominal tenderness.  Musculoskeletal:     Right lower leg: No edema.     Left lower leg: No edema.  Skin:    General: Skin is warm.     Findings: No erythema or rash.  Neurological:     General: No focal deficit present.     Mental Status: He is alert and oriented to person, place, and time.     Gait: Gait normal.  Psychiatric:        Mood and Affect: Mood and affect normal.    ASSESSMENT AND PLAN:  Mr. Jose Reyes was seen today for sexually transmitted disease screening.  Diagnoses and  all orders for this visit:  Primary hypertension BP elevated today, improved some after a few minutes. He is not monitoring BP at home, instructed to do so. We discussed possible complications of poorly controlled hypertension. He is planning on arranging appointment with his cardiologist. Continue metoprolol  succinate 25 mg daily.  Screen for STD (sexually transmitted disease) Denies risk factor for STDs. He is not interested in HIV  or RPR today. Urine cytology collected today.  -     Urine cytology ancillary only  Erectile dysfunction, unspecified erectile dysfunction type Chronic. He has not tried any medication in the past. Recommend trying sildenafil , starting with 20 mg up to 100 mg daily as needed. We discussed some side effects with medication. Continue following with PCP.  -     sildenafil  (REVATIO ) 20 MG tablet; Take 2 tablets (40 mg total) by mouth daily as needed.  Return if symptoms worsen or fail to improve.  Leonce Bale G. Marty Sadlowski, MD  [facility]. [place] office.     [1]  Current Outpatient Medications on File Prior to Visit  Medication Sig Dispense Refill   aspirin  81 MG chewable tablet Chew 1 tablet (81 mg total) by mouth daily. 90 tablet 3   EPINEPHrine  (EPIPEN  2-PAK) 0.3 mg/0.3 mL IJ SOAJ injection Inject 0.3 mLs (0.3 mg total) into the muscle as needed for anaphylaxis. 1 Device 2   Evolocumab  (REPATHA  SURECLICK) 140 MG/ML SOAJ Inject 140 mg into the skin every 14 (fourteen) days. 6 mL 1   metoprolol  succinate (TOPROL -XL) 25 MG 24 hr tablet Take 1 tablet (25 mg total) by mouth daily. 90 tablet 3   prasugrel  (EFFIENT ) 10 MG TABS tablet Take 1 tablet (10 mg total) by mouth daily. 90 tablet 3   rosuvastatin  (CRESTOR ) 20 MG tablet Take 1 tablet (20 mg total) by mouth daily. 90 tablet 3   sertraline  (ZOLOFT ) 100 MG tablet TAKE 1.5 TABLETS (150 MG TOTAL) BY MOUTH DAILY 135 tablet 1   No current facility-administered medications on file prior to visit.   [2]  Allergies Allergen Reactions   Amoxicillin Hives   Penicillins Hives   Bee Venom    "

## 2024-03-17 ENCOUNTER — Other Ambulatory Visit

## 2024-03-17 DIAGNOSIS — Z113 Encounter for screening for infections with a predominantly sexual mode of transmission: Secondary | ICD-10-CM | POA: Diagnosis present

## 2024-03-17 LAB — COMPREHENSIVE METABOLIC PANEL WITH GFR
ALT: 25 U/L (ref 3–53)
AST: 23 U/L (ref 5–37)
Albumin: 4.4 g/dL (ref 3.5–5.2)
Alkaline Phosphatase: 47 U/L (ref 39–117)
BUN: 15 mg/dL (ref 6–23)
CO2: 28 meq/L (ref 19–32)
Calcium: 9.3 mg/dL (ref 8.4–10.5)
Chloride: 104 meq/L (ref 96–112)
Creatinine, Ser: 0.99 mg/dL (ref 0.40–1.50)
GFR: 88.78 mL/min
Glucose, Bld: 97 mg/dL (ref 70–99)
Potassium: 4.3 meq/L (ref 3.5–5.1)
Sodium: 138 meq/L (ref 135–145)
Total Bilirubin: 0.4 mg/dL (ref 0.2–1.2)
Total Protein: 7.3 g/dL (ref 6.0–8.3)

## 2024-03-17 LAB — LIPID PANEL
Cholesterol: 183 mg/dL (ref 28–200)
HDL: 58.8 mg/dL
LDL Cholesterol: 107 mg/dL — ABNORMAL HIGH (ref 10–99)
NonHDL: 124.48
Total CHOL/HDL Ratio: 3
Triglycerides: 88 mg/dL (ref 10.0–149.0)
VLDL: 17.6 mg/dL (ref 0.0–40.0)

## 2024-03-17 LAB — TESTOSTERONE: Testosterone: 264.58 ng/dL — ABNORMAL LOW (ref 300.00–890.00)

## 2024-03-18 LAB — URINE CYTOLOGY ANCILLARY ONLY
Chlamydia: NEGATIVE
Comment: NEGATIVE
Comment: NEGATIVE
Comment: NORMAL
Neisseria Gonorrhea: NEGATIVE
Trichomonas: NEGATIVE

## 2024-03-19 ENCOUNTER — Ambulatory Visit: Payer: Self-pay | Admitting: Family Medicine

## 2024-03-20 ENCOUNTER — Other Ambulatory Visit (HOSPITAL_COMMUNITY): Payer: Self-pay

## 2024-03-20 ENCOUNTER — Ambulatory Visit: Payer: Self-pay | Admitting: Family Medicine

## 2024-03-20 ENCOUNTER — Telehealth: Payer: Self-pay

## 2024-03-20 ENCOUNTER — Telehealth: Payer: Self-pay | Admitting: Pharmacist Clinician (PhC)/ Clinical Pharmacy Specialist

## 2024-03-20 NOTE — Telephone Encounter (Signed)
 Pt calling to ask orders for 3 mo f/u labs be put in. He also wants to know if he needs to be seen in office. Please advise.

## 2024-03-20 NOTE — Telephone Encounter (Signed)
 Pharmacy Patient Advocate Encounter   Received notification from [facility] KEY that prior authorization for Sildenafil  citrate 20 tabs is required/requested.   Insurance verification completed.   The patient is insured through [employer].   Per test claim: PA required; PA submitted to above mentioned insurance via Latent Key/confirmation #/[id number] [facility] Status is pending

## 2024-04-10 ENCOUNTER — Ambulatory Visit: Attending: Cardiovascular Disease | Admitting: Cardiovascular Disease

## 2024-04-10 ENCOUNTER — Other Ambulatory Visit: Payer: Self-pay | Admitting: *Deleted

## 2024-04-10 ENCOUNTER — Encounter: Payer: Self-pay | Admitting: Cardiovascular Disease

## 2024-04-10 VITALS — BP 112/70 | HR 72 | Ht 76.0 in | Wt 228.0 lb

## 2024-04-10 DIAGNOSIS — I10 Essential (primary) hypertension: Secondary | ICD-10-CM

## 2024-04-10 DIAGNOSIS — E785 Hyperlipidemia, unspecified: Secondary | ICD-10-CM

## 2024-04-10 DIAGNOSIS — I2510 Atherosclerotic heart disease of native coronary artery without angina pectoris: Secondary | ICD-10-CM

## 2024-04-10 NOTE — Patient Instructions (Signed)
 Medication Instructions:  Your physician recommends that you continue on your current medications as directed. Please refer to the Current Medication list given to you today.  *If you need a refill on your cardiac medications before your next appointment, please call your pharmacy*  Lab Work: Have fasting lab work done in about 12 weeks.  Lipid and liver profiles.  Can be done at the [facility] on the first floor of our building or any [facility] location If you have labs (blood work) drawn today and your tests are completely normal, you will receive your results only by: MyChart Message (if you have MyChart) OR A paper copy in the mail If you have any lab test that is abnormal or we need to change your treatment, we will call you to review the results.  Testing/Procedures: none  Follow-Up: At [facility], you and your health needs are our priority.  As part of our continuing mission to provide you with exceptional heart care, our providers are all part of one team.  This team includes your primary Cardiologist (physician) and Advanced Practice Providers or APPs (Physician Assistants and Nurse Practitioners) who all work together to provide you with the care you need, when you need it.  Your next appointment:   12 month(s)  Provider:   Lonni Cash, MD    We recommend signing up for the patient portal called MyChart.  Sign up information is provided on this After Visit Summary.  MyChart is used to connect with patients for Virtual Visits (Telemedicine).  Patients are able to view lab/test results, encounter notes, upcoming appointments, etc.  Non-urgent messages can be sent to your provider as well.   To learn more about what you can do with MyChart, go to [email or web address].   Other Instructions

## 2024-05-01 ENCOUNTER — Ambulatory Visit: Admitting: Cardiovascular Disease
